# Patient Record
Sex: Male | Born: 1970 | Race: White | Hispanic: No | Marital: Married | Smoking: Former smoker
Health system: Southern US, Community
[De-identification: ages and names within clinical notes are randomized; demographics above are authoritative.]

## PROBLEM LIST (undated history)

## (undated) DIAGNOSIS — I10 Essential (primary) hypertension: Secondary | ICD-10-CM

## (undated) DIAGNOSIS — F419 Anxiety disorder, unspecified: Secondary | ICD-10-CM

## (undated) DIAGNOSIS — G4730 Sleep apnea, unspecified: Secondary | ICD-10-CM

## (undated) DIAGNOSIS — J45909 Unspecified asthma, uncomplicated: Secondary | ICD-10-CM

## (undated) DIAGNOSIS — F909 Attention-deficit hyperactivity disorder, unspecified type: Secondary | ICD-10-CM

## (undated) HISTORY — PX: EYE SURGERY: SHX253

## (undated) HISTORY — PX: SPINE SURGERY: SHX786

## (undated) HISTORY — DX: Essential (primary) hypertension: I10

## (undated) HISTORY — PX: HERNIA REPAIR: SHX51

## (undated) HISTORY — DX: Anxiety disorder, unspecified: F41.9

---

## 2005-07-20 ENCOUNTER — Ambulatory Visit (HOSPITAL_COMMUNITY): Admission: RE | Admit: 2005-07-20 | Discharge: 2005-07-21 | Payer: Self-pay | Admitting: Neurosurgery

## 2012-05-29 ENCOUNTER — Ambulatory Visit (INDEPENDENT_AMBULATORY_CARE_PROVIDER_SITE_OTHER): Payer: BC Managed Care – PPO | Admitting: Emergency Medicine

## 2012-05-29 VITALS — BP 135/83 | HR 73 | Temp 98.8°F | Resp 16 | Ht 72.0 in | Wt 267.0 lb

## 2012-05-29 DIAGNOSIS — J209 Acute bronchitis, unspecified: Secondary | ICD-10-CM

## 2012-05-29 MED ORDER — ALBUTEROL 90 MCG/ACT IN AERS: 2.0000 | INHALATION_SPRAY | RESPIRATORY_TRACT | Status: DC | PRN

## 2012-05-29 MED ORDER — PROMETHAZINE-CODEINE 6.25-10 MG/5ML PO SYRP: 5.0000 mL | ORAL_SOLUTION | Freq: Four times a day (QID) | ORAL | Status: AC | PRN

## 2012-05-29 NOTE — Patient Instructions (Signed)
  Place cough patient instructions here.  

## 2012-05-29 NOTE — Progress Notes (Signed)
  Subjective:     Billy Gomez is a 41 y.o. male here for evaluation of a cough. Onset of symptoms was 3 week ago. Symptoms have been gradually improving since that time. The cough is productive of yellow sputum and raspy and is aggravated by nothing. Associated symptoms include: postnasal drip. Patient does not have a history of asthma. Patient does not have a history of environmental allergens. Patient has not traveled recently. Patient does not have a history of smoking. Patient has had a previous chest x-ray. Patient has not had a PPD done.  The following portions of the patient's history were reviewed and updated as appropriate: allergies, current medications, past family history, past medical history, past social history, past surgical history and problem list.  Review of Systems A comprehensive review of systems was negative.    Objective:    Oxygen saturation 21% on room air BP 135/83  Pulse 73  Temp(Src) 98.8 F (37.1 C) (Oral)  Resp 16  Ht 6' (1.829 m)  Wt 267 lb (121.11 kg)  BMI 36.21 kg/m2  SpO2 97%  General Appearance:    Alert, cooperative, no distress, appears stated age  Head:    Normocephalic, without obvious abnormality, atraumatic  Eyes:    PERRL, conjunctiva/corneas clear, EOM's intact, fundi    benign, both eyes       Ears:    Normal TM's and external ear canals, both ears  Nose:   Nares normal, septum midline, mucosa normal, no drainage    or sinus tenderness  Throat:   Lips, mucosa, and tongue normal; teeth and gums normal  Neck:   Supple, symmetrical, trachea midline, no adenopathy;       thyroid:  No enlargement/tenderness/nodules; no carotid   bruit or JVD  Back:     Symmetric, no curvature, ROM normal, no CVA tenderness  Lungs:     Clear to auscultation bilaterally, respirations unlabored  Chest wall:    No tenderness or deformity  Heart:    Regular rate and rhythm, S1 and S2 normal, no murmur, rub   or gallop  Abdomen:     Soft, non-tender, bowel  sounds active all four quadrants,    no masses, no organomegaly  Genitalia:    Normal male without lesion, discharge or tenderness  Rectal:    Normal tone, normal prostate, no masses or tenderness;   guaiac negative stool  Extremities:   Extremities normal, atraumatic, no cyanosis or edema  Pulses:   2+ and symmetric all extremities  Skin:   Skin color, texture, turgor normal, no rashes or lesions  Lymph nodes:   Cervical, supraclavicular, and axillary nodes normal  Neurologic:   CNII-XII intact. Normal strength, sensation and reflexes      throughout      Assessment:    Reactive Airway Disease    Plan:    Explained lack of efficacy of antibiotics in viral disease. Antitussives per medication orders. Avoid exposure to tobacco smoke and fumes. B-agonist inhaler. Call if shortness of breath worsens, blood in sputum, change in character of cough, development of fever or chills, inability to maintain nutrition and hydration. Avoid exposure to tobacco smoke and fumes.

## 2013-03-18 ENCOUNTER — Ambulatory Visit (INDEPENDENT_AMBULATORY_CARE_PROVIDER_SITE_OTHER): Payer: BC Managed Care – PPO | Admitting: Emergency Medicine

## 2013-03-18 VITALS — BP 158/94 | HR 70 | Temp 99.0°F | Resp 17 | Ht 71.5 in | Wt 272.0 lb

## 2013-03-18 DIAGNOSIS — J309 Allergic rhinitis, unspecified: Secondary | ICD-10-CM

## 2013-03-18 DIAGNOSIS — J9801 Acute bronchospasm: Secondary | ICD-10-CM

## 2013-03-18 MED ORDER — FLUTICASONE-SALMETEROL 250-50 MCG/DOSE IN AEPB: 1.0000 | INHALATION_SPRAY | Freq: Two times a day (BID) | RESPIRATORY_TRACT | Status: DC

## 2013-03-18 NOTE — Patient Instructions (Addendum)
Allergic Rhinitis  Allergic rhinitis is when the mucous membranes in the nose respond to allergens. Allergens are particles in the air that cause your body to have an allergic reaction. This causes you to release allergic antibodies. Through a chain of events, these eventually cause you to release histamine into the blood stream (hence the use of antihistamines). Although meant to be protective to the body, it is this release that causes your discomfort, such as frequent sneezing, congestion and an itchy runny nose.    CAUSES    The pollen allergens may come from grasses, trees, and weeds. This is seasonal allergic rhinitis, or "hay fever." Other allergens cause year-round allergic rhinitis (perennial allergic rhinitis) such as house dust mite allergen, pet dander and mold spores.    SYMPTOMS     Nasal stuffiness (congestion).   Runny, itchy nose with sneezing and tearing of the eyes.   There is often an itching of the mouth, eyes and ears.  It cannot be cured, but it can be controlled with medications.  DIAGNOSIS    If you are unable to determine the offending allergen, skin or blood testing may find it.  TREATMENT     Avoid the allergen.   Medications and allergy shots (immunotherapy) can help.   Hay fever may often be treated with antihistamines in pill or nasal spray forms. Antihistamines block the effects of histamine. There are over-the-counter medicines that may help with nasal congestion and swelling around the eyes. Check with your caregiver before taking or giving this medicine.  If the treatment above does not work, there are many new medications your caregiver can prescribe. Stronger medications may be used if initial measures are ineffective. Desensitizing injections can be used if medications and avoidance fails. Desensitization is when a patient is given ongoing shots until the body becomes less sensitive to the allergen. Make sure you follow up with your caregiver if problems continue.   SEEK MEDICAL CARE IF:     You develop fever (more than 100.5 F (38.1 C).   You develop a cough that does not stop easily (persistent).   You have shortness of breath.   You start wheezing.   Symptoms interfere with normal daily activities.  Document Released: 09/06/2001 Document Revised: 03/05/2012 Document Reviewed: 03/18/2009  [employer] Patient Information 2013 [employer].

## 2013-03-18 NOTE — Progress Notes (Signed)
[facility] [address] [place] [address] [phone]  Date:  03/18/2013   Name:  Billy Gomez   DOB:  Jun 13, 1971   MRN:  [id number]  PCP:  No primary provider on file.    Chief Complaint: Shortness of Breath   History of Present Illness:  Billy Gomez is a 42 y.o. very pleasant male patient who presents with the following:  History of recurrent bronchitis with reactive airway disease. Has for the past three weeks needed to use the rescue inhaler multiple times a day, especially at night.  No fever or chills.  No wheezing or shortness of breath.  Has a cough productive of occasional purulent sputum.  No nausea or vomiting.  No coryza.  No improvement with over the counter medications or other home remedies. Denies other complaint or health concern today.   There is no problem list on file for this patient.   Past Medical History  Diagnosis Date  . Anxiety   . Hypertension     Past Surgical History  Procedure Laterality Date  . Hernia repair    . Spine surgery      History  Substance Use Topics  . Smoking status: Current Some Day Smoker    Types: Cigarettes  . Smokeless tobacco: Former Neurosurgeon    Types: Snuff    Quit date: 04/28/2012  . Alcohol Use: No    Family History  Problem Relation Age of Onset  . Asthma Sister   . Allergies Sister   . Heart disease Maternal Grandfather   . Heart disease Paternal Grandfather     No Known Allergies  Medication list has been reviewed and updated.  Current Outpatient Prescriptions on File Prior to Visit  Medication Sig Dispense Refill  . albuterol (PROVENTIL,VENTOLIN) 90 MCG/ACT inhaler Inhale 2 puffs into the lungs every 4 (four) hours as needed.  17 g  3  . hydrochlorothiazide (HYDRODIURIL) 25 MG tablet Take 25 mg by mouth daily.      . pravastatin (PRAVACHOL) 10 MG tablet Take 10 mg by mouth daily.      Marland Kitchen buPROPion (WELLBUTRIN) 75 MG tablet Take 75 mg by mouth 2 (two) times daily.        No current facility-administered medications on file prior to visit.    Review of Systems:  As per HPI, otherwise negative.    Physical Examination: Filed Vitals:   03/18/13 0922  BP: 158/94  Pulse: 70  Temp: 99 F (37.2 C)  Resp: 17   Filed Vitals:   03/18/13 0922  Height: 5' 11.5" (1.816 m)  Weight: 272 lb (123.378 kg)   Body mass index is 37.41 kg/(m^2). Ideal Body Weight: Weight in (lb) to have BMI = 25: 181.4  GEN: WDWN, NAD, Non-toxic, A & O x 3 HEENT: Atraumatic, Normocephalic. Neck supple. No masses, No LAD. Ears and Nose: No external deformity. CV: RRR, No M/G/R. No JVD. No thrill. No extra heart sounds. PULM: CTA B, scattered wheezes, no crackles, rhonchi. No retractions. No resp. distress. No accessory muscle use. ABD: S, NT, ND, +BS. No rebound. No HSM. EXTR: No c/c/e NEURO Normal gait.  PSYCH: Normally interactive. Conversant. Not depressed or anxious appearing.  Calm demeanor.    Assessment and Plan: Bronchospasm allergic rhinitis Add advair and allegra  Signed,  Phillips Odor, MD

## 2014-03-22 ENCOUNTER — Other Ambulatory Visit: Payer: Self-pay | Admitting: Emergency Medicine

## 2014-04-22 ENCOUNTER — Other Ambulatory Visit: Payer: Self-pay | Admitting: Physician Assistant

## 2014-05-13 ENCOUNTER — Other Ambulatory Visit: Payer: Self-pay | Admitting: Physician Assistant

## 2014-05-31 ENCOUNTER — Telehealth: Payer: Self-pay

## 2014-05-31 NOTE — Telephone Encounter (Signed)
NEED REFILL OF ADVAIR

## 2014-06-02 NOTE — Telephone Encounter (Signed)
Notified pt that we need to see him to RF since not seen >yr. Pt voiced agreement and gave him office hours.

## 2014-08-30 ENCOUNTER — Ambulatory Visit (INDEPENDENT_AMBULATORY_CARE_PROVIDER_SITE_OTHER): Payer: Managed Care, Other (non HMO) | Admitting: Internal Medicine

## 2014-08-30 VITALS — BP 142/74 | HR 88 | Temp 98.2°F | Resp 18 | Ht 72.0 in | Wt 258.2 lb

## 2014-08-30 DIAGNOSIS — J4530 Mild persistent asthma, uncomplicated: Secondary | ICD-10-CM | POA: Insufficient documentation

## 2014-08-30 DIAGNOSIS — E785 Hyperlipidemia, unspecified: Secondary | ICD-10-CM

## 2014-08-30 DIAGNOSIS — L723 Sebaceous cyst: Secondary | ICD-10-CM

## 2014-08-30 DIAGNOSIS — I10 Essential (primary) hypertension: Secondary | ICD-10-CM

## 2014-08-30 DIAGNOSIS — Z23 Encounter for immunization: Secondary | ICD-10-CM

## 2014-08-30 DIAGNOSIS — E782 Mixed hyperlipidemia: Secondary | ICD-10-CM | POA: Insufficient documentation

## 2014-08-30 DIAGNOSIS — Z125 Encounter for screening for malignant neoplasm of prostate: Secondary | ICD-10-CM

## 2014-08-30 DIAGNOSIS — J45909 Unspecified asthma, uncomplicated: Secondary | ICD-10-CM

## 2014-08-30 DIAGNOSIS — Z6835 Body mass index (BMI) 35.0-35.9, adult: Secondary | ICD-10-CM

## 2014-08-30 LAB — COMPREHENSIVE METABOLIC PANEL
ALK PHOS: 55 U/L (ref 39–117)
ALT: 41 U/L (ref 0–53)
AST: 21 U/L (ref 0–37)
Albumin: 5.1 g/dL (ref 3.5–5.2)
BILIRUBIN TOTAL: 0.8 mg/dL (ref 0.2–1.2)
BUN: 17 mg/dL (ref 6–23)
CO2: 28 mEq/L (ref 19–32)
CREATININE: 0.84 mg/dL (ref 0.50–1.35)
Calcium: 9.7 mg/dL (ref 8.4–10.5)
Chloride: 103 mEq/L (ref 96–112)
GLUCOSE: 98 mg/dL (ref 70–99)
POTASSIUM: 3.9 meq/L (ref 3.5–5.3)
SODIUM: 139 meq/L (ref 135–145)
Total Protein: 7.2 g/dL (ref 6.0–8.3)

## 2014-08-30 LAB — LIPID PANEL
Cholesterol: 232 mg/dL — ABNORMAL HIGH (ref 0–200)
HDL: 53 mg/dL (ref >39)
LDL CALC: 113 mg/dL — AB (ref 0–99)
TRIGLYCERIDES: 329 mg/dL — AB (ref <150)
Total CHOL/HDL Ratio: 4.4 Ratio
VLDL: 66 mg/dL — AB (ref 0–40)

## 2014-08-30 LAB — CBC WITH DIFFERENTIAL/PLATELET
BASOS PCT: 1 % (ref 0–1)
Basophils Absolute: 0.1 10*3/uL (ref 0.0–0.1)
EOS PCT: 8 % — AB (ref 0–5)
Eosinophils Absolute: 0.6 10*3/uL (ref 0.0–0.7)
HCT: 47.2 % (ref 39.0–52.0)
HEMOGLOBIN: 16.1 g/dL (ref 13.0–17.0)
Lymphocytes Relative: 29 % (ref 12–46)
Lymphs Abs: 2.2 10*3/uL (ref 0.7–4.0)
MCH: 32.7 pg (ref 26.0–34.0)
MCHC: 34.1 g/dL (ref 30.0–36.0)
MCV: 95.7 fL (ref 78.0–100.0)
MONO ABS: 0.8 10*3/uL (ref 0.1–1.0)
MONOS PCT: 10 % (ref 3–12)
Neutro Abs: 4 10*3/uL (ref 1.7–7.7)
Neutrophils Relative %: 52 % (ref 43–77)
PLATELETS: 202 10*3/uL (ref 150–400)
RBC: 4.93 MIL/uL (ref 4.22–5.81)
RDW: 13 % (ref 11.5–15.5)
WBC: 7.6 10*3/uL (ref 4.0–10.5)

## 2014-08-30 MED ORDER — HYDROCHLOROTHIAZIDE 12.5 MG PO CAPS: 12.5000 mg | ORAL_CAPSULE | Freq: Every day | ORAL | Status: DC

## 2014-08-30 MED ORDER — FLUTICASONE-SALMETEROL 250-50 MCG/DOSE IN AEPB: 1.0000 | INHALATION_SPRAY | Freq: Two times a day (BID) | RESPIRATORY_TRACT | Status: DC

## 2014-08-30 MED ORDER — ALBUTEROL SULFATE HFA 108 (90 BASE) MCG/ACT IN AERS: 2.0000 | INHALATION_SPRAY | Freq: Four times a day (QID) | RESPIRATORY_TRACT | Status: DC | PRN

## 2014-08-30 NOTE — Progress Notes (Addendum)
Subjective:    Patient ID: NEGAN GRUDZIEN, male    DOB: [birth year], 43 y.o.   MRN: [id number] This chart was scribed for Ellamae Sia, MD by [place] Peace, ED Scribe. The patient was seen in [facility].. The patient's care was started at 9:01 AM.   HPI HPI Comments: UZZIAH RIGG is a 43 y.o. male who presents to the [facility] complaining of cyst to the lateral aspect of the left side of his neck which pt reports has been there for a while. He reports that it started off as a little "spec" and then after squeezing on it to get some drainage out of it, the problematic area started to grow and harden. He denies any noticed drainage.   Pt also expresses concern regarding brown "mole" on the lateral aspect of his right forearm that he is worried maybe cancerous. He reports that the spot has been there for 6 months.  Pt also wants to receive medication to treat his "asthma" that he states he has not been formally diagnosed with having but states he believes he has. He states that he has been using Advair frequently in order to address issue which has provided some relief. Pt states that he does notice himself wheezing sometimes and reports family history of breathing problems with his father. He states that he was previously 40 lbs heavier than he is today.   Pt denies that he is current smoker but states he does use tobacco products. Pt has family history of mental illnesses in his family and heart attacks with both of his grandfathers. He also reports history of prostate cancer in his grandfather and breast cancer in another family member. He states he does suffer occasionally with acid reflux problems and further reports diagnosis of sleep apnea four years ago. His last tetanus shot is unknown and elects to receive one today.   SH stable  Review of Systems  Constitutional: Negative for fever and chills.  HENT:       Recent eye exam wnl x vision  Respiratory: Negative for shortness of breath.     Cardiovascular: Negative for chest pain.  Gastrointestinal: Negative for nausea, vomiting and abdominal pain.       Rare gerd  Genitourinary: Negative.   Musculoskeletal: Negative.   Skin:       Cyst to left side of neck.  Unusual brown mole on right forearm.   Neurological: Negative.        Objective:   Physical Exam  Nursing note and vitals reviewed. Constitutional: He is oriented to person, place, and time. He appears well-developed and well-nourished. No distress.  HENT:  Head: Normocephalic and atraumatic.  Eyes: Conjunctivae and EOM are normal. Pupils are equal, round, and reactive to light.  Neck: Neck supple. No thyromegaly present.  Cardiovascular: Normal rate, regular rhythm and normal heart sounds.   No murmur heard. Pulmonary/Chest: Effort normal and breath sounds normal. No respiratory distress. He has no wheezes.  Musculoskeletal: Normal range of motion. He exhibits no edema.  Lymphadenopathy:    He has no cervical adenopathy.  Neurological: He is alert and oriented to person, place, and time.  Skin: Skin is warm and dry.  1cm freely movable firm subcutnaeous nodule on left posterior cervical area. Sebker right arm.   Psychiatric: He has a normal mood and affect. His behavior is normal.          Assessment & Plan:  Treatment plan was discussed with patient who verbalizes understanding and  agrees.  I have completed the patient encounter in its entirety as documented by the scribe, with editing by me where necessary. Lemon Whitacre P. Merla Riches, M.D. Essential hypertension - Plan: CBC with Differential, Comprehensive metabolic panel  Other and unspecified hyperlipidemia - Plan: Lipid panel  Screening for prostate cancer - Plan: PSA  Need for diphtheria-tetanus-pertussis (Tdap) vaccine - Plan: Tdap vaccine greater than or equal to 7yo IM  Sebaceous cyst--excision  RAD (reactive airway disease), mild persistent, uncomplicated  BMI 35.0-35.9,adult  Meds  ordered this encounter  Medications  . Fluticasone-Salmeterol (ADVAIR DISKUS) 250-50 MCG/DOSE AEPB    Sig: Inhale 1 puff into the lungs 2 (two) times daily.    Dispense:  1 each    Refill:  5  . albuterol (PROVENTIL HFA;VENTOLIN HFA) 108 (90 BASE) MCG/ACT inhaler    Sig: Inhale 2 puffs into the lungs every 6 (six) hours as needed for wheezing or shortness of breath.    Dispense:  1 Inhaler    Refill:  2  . hydrochlorothiazide (MICROZIDE) 12.5 MG capsule    Sig: Take 1 capsule (12.5 mg total) by mouth daily.    Dispense:  30 capsule    Refill:  11   Will send in lipid rx after labs Changed to 12.5 hctz F/u SR  Add-needs lipitor at 

## 2014-08-30 NOTE — Progress Notes (Signed)
PROCEDURE: Verbal consent obtained from patient.  Local anesthesia with 3cc Lidocaine 2% with epinephrine.  Sterile prep and drape.  Incision with 15 blade, and dissection of the cyst, with removal en toto, though ruptured.  Wound cavity explored and sebaceous material removed.  Wound closed with #2 5-0 Prolene horizontal mattress sutures.  Wound cleansed and dressed.

## 2014-08-30 NOTE — Patient Instructions (Signed)

## 2014-09-01 ENCOUNTER — Encounter: Payer: Self-pay | Admitting: Internal Medicine

## 2014-09-01 LAB — PSA: PSA: 2.45 ng/mL (ref <=4.00)

## 2014-09-02 MED ORDER — ATORVASTATIN CALCIUM 20 MG PO TABS: 20.0000 mg | ORAL_TABLET | Freq: Every day | ORAL | Status: DC

## 2014-09-02 NOTE — Addendum Note (Signed)
Addended by: Tonye Pearson on: 09/02/2014 06:53 PM   Modules accepted: Orders

## 2015-01-27 ENCOUNTER — Ambulatory Visit (INDEPENDENT_AMBULATORY_CARE_PROVIDER_SITE_OTHER): Payer: Managed Care, Other (non HMO)

## 2015-01-27 ENCOUNTER — Ambulatory Visit (INDEPENDENT_AMBULATORY_CARE_PROVIDER_SITE_OTHER): Payer: Managed Care, Other (non HMO) | Admitting: Family Medicine

## 2015-01-27 VITALS — BP 128/86 | HR 69 | Temp 98.1°F | Resp 18 | Ht 73.0 in | Wt 271.6 lb

## 2015-01-27 DIAGNOSIS — R062 Wheezing: Secondary | ICD-10-CM

## 2015-01-27 DIAGNOSIS — R05 Cough: Secondary | ICD-10-CM

## 2015-01-27 DIAGNOSIS — J4521 Mild intermittent asthma with (acute) exacerbation: Secondary | ICD-10-CM

## 2015-01-27 DIAGNOSIS — R059 Cough, unspecified: Secondary | ICD-10-CM

## 2015-01-27 MED ORDER — AZITHROMYCIN 250 MG PO TABS: ORAL_TABLET | ORAL | Status: DC

## 2015-01-27 MED ORDER — ALBUTEROL SULFATE (2.5 MG/3ML) 0.083% IN NEBU
2.5000 mg | INHALATION_SOLUTION | Freq: Once | RESPIRATORY_TRACT | Status: AC
  Administered 2015-01-27: 2.5 mg via RESPIRATORY_TRACT

## 2015-01-27 NOTE — Progress Notes (Signed)
Subjective:  This chart was scribed for Billy Staggers MD, by Veverly Fells, at [facility].  This patient was seen in room 10 and the patient's care was started at 9:54 AM.    Patient ID: Billy Gomez, male    DOB: 10-02-1971, 44 y.o.   MRN: [id number]  HPI  HPI Comments: Billy Gomez is a 44 y.o. male who presents to [facility] for a cough.  In review of his chart he has a history of hypertension and hyperlipidemia reactive airway disease as was seen by Dr.Dootlittle Sep 5th 2015.  He has been using Advair at times when he heard himself wheezing.  Also noted to have acid reflux at that time.  As well as a previous diagnosis of sleep apnea. He was continued on Advair 250/50 1 puff twice per day and albuterol as needed.   Cough: Onset several weeks ago.  Patient notes that his cough is worse when he is laying down and notes of a rattling sound.  States that his cough is keeping him up some nights and feels the congestion when he takes a deep exhale or gets winded at work. He also states that he had intermittent heartburn few days ago which was alleviated with Tums.  Denies any fever associated with his cough, shortness of breath, chest pain/ pressure, swelling of his extremities. Uses a CPAP at night for his sleep apnea.He is still taking advair (1 puff twice a day).  He has never been tested for asthma.  Patient has been taking cough medication (Dayquil) to try to alleviate his symptoms. Patient uses dip(snuff), and has smoked in the past. Patient has a family history of heart disease (both grandfathers), but no one else in his direct family.  Mother had cancer. Patient is around many customers at work.      Patient Active Problem List   Diagnosis Date Noted  . Essential hypertension 08/30/2014  . Other and unspecified hyperlipidemia 08/30/2014  . RAD (reactive airway disease) 08/30/2014  . BMI 35.0-35.9,adult 08/30/2014   Past  Medical History  Diagnosis Date  . Anxiety   . Hypertension    Past Surgical History  Procedure Laterality Date  . Hernia repair    . Spine surgery     No Known Allergies Prior to Admission medications   Medication Sig Start Date End Date Taking? Authorizing Provider  albuterol (PROVENTIL HFA;VENTOLIN HFA) 108 (90 BASE) MCG/ACT inhaler Inhale 2 puffs into the lungs every 6 (six) hours as needed for wheezing or shortness of breath. 08/30/14  Yes Tonye Pearson, MD  atorvastatin (LIPITOR) 20 MG tablet Take 1 tablet (20 mg total) by mouth daily. 09/02/14  Yes Tonye Pearson, MD  Fluticasone-Salmeterol (ADVAIR DISKUS) 250-50 MCG/DOSE AEPB Inhale 1 puff into the lungs 2 (two) times daily. 08/30/14  Yes Tonye Pearson, MD  hydrochlorothiazide (MICROZIDE) 12.5 MG capsule Take 1 capsule (12.5 mg total) by mouth daily. 08/30/14  Yes Tonye Pearson, MD  pravastatin (PRAVACHOL) 10 MG tablet Take 10 mg by mouth daily.    Historical Provider, MD   History   Social History  . Marital Status: Married    Spouse Name: N/A    Number of Children: N/A  . Years of Education: N/A   Occupational History  . Not on file.   Social History Main Topics  . Smoking status: Current Some Day Smoker    Types: Cigarettes  . Smokeless tobacco: Former Neurosurgeon  Types: Snuff    Quit date: 04/28/2012  . Alcohol Use: No  . Drug Use: No  . Sexual Activity: Yes    Birth Control/ Protection: None   Other Topics Concern  . Not on file   Social History Narrative         Review of Systems  Constitutional: Negative for fever.  Respiratory: Positive for cough. Negative for shortness of breath.   Cardiovascular: Negative for chest pain and leg swelling.       Objective:   Physical Exam  Constitutional: He is oriented to person, place, and time. He appears well-developed and well-nourished.  HENT:  Head: Normocephalic and atraumatic.  Right Ear: Tympanic membrane, external ear and ear canal  normal.  Left Ear: Tympanic membrane, external ear and ear canal normal.  Nose: No rhinorrhea.  Mouth/Throat: Oropharynx is clear and moist and mucous membranes are normal. No oropharyngeal exudate or posterior oropharyngeal erythema.  Eyes: Conjunctivae are normal. Pupils are equal, round, and reactive to light.  Neck: Neck supple.  Cardiovascular: Normal rate, regular rhythm, normal heart sounds and intact distal pulses.   No murmur heard. Pulmonary/Chest: No respiratory distress. He has no rhonchi. He has no rales.  He has diffused expriatory wheeze with some coarse breath sounds left lower lobe.     Abdominal: Soft. There is no tenderness.  Lymphadenopathy:    He has no cervical adenopathy.  Neurological: He is alert and oriented to person, place, and time.  Skin: Skin is warm and dry. No rash noted.  Psychiatric: He has a normal mood and affect. His behavior is normal.  Vitals reviewed.  Filed Vitals:   01/27/15 0944  BP: 128/86  Pulse: 69  Temp: 98.1 F (36.7 C)  TempSrc: Oral  Resp: 18  Height: 6\' 1"  (1.854 m)  Weight: 271 lb 9.6 oz (123.197 kg)  SpO2: 98%     Albuterol 2.5 Neb given.  He has improved airflow. Still few coarse breath sounds bases bilaterally. He subjectively felt improved. Peak flow reading is 650, about over 100 % of predicted.  [facility] (PRIMARY) x-ray report read by Dr. Neva Seat He has diffuse scattered bronchial markings, left greater than right without discrete infiltrate.         Assessment & Plan:   Billy Gomez is a 44 y.o. male Cough - Plan: DG Chest 2 View, albuterol (PROVENTIL) (2.5 MG/3ML) 0.083% nebulizer solution 2.5 mg, azithromycin (ZITHROMAX) 250 MG tablet  Wheezing - Plan: DG Chest 2 View, albuterol (PROVENTIL) (2.5 MG/3ML) 0.083% nebulizer solution 2.5 mg  Asthmatic bronchitis, mild intermittent, with acute exacerbation - Plan: azithromycin (ZITHROMAX) 250 MG tablet  Suspected asthmatic bronchitis flare, but may have some  element of LPR.  -start Zpak, mucinex (stop if increasing wheeze), and ok to try zantac for next week or two for LPR if this is contributing to cough.   -albuterol if needed, but if persistent or frequent use - RTC for possible prednisone start.   -rtc precautions.    Meds ordered this encounter  Medications  . albuterol (PROVENTIL) (2.5 MG/3ML) 0.083% nebulizer solution 2.5 mg    Sig:   . azithromycin (ZITHROMAX) 250 MG tablet    Sig: Take 2 pills by mouth on day 1, then 1 pill by mouth per day on days 2 through 5.    Dispense:  6 tablet    Refill:  0   Patient Instructions  Start antibiotic for bronchitis (Zpak).  Zantac over the counter once per day for  heartburn (as reflux can also cause cough). Mucinex over the counter ok UNLESS it makes you wheeze more - then stop it.  Continue Advair twice per day, albuterol inhaler up to every 4 hours as needed for wheezing.  If you require the albuterol more than 2-3 times per day or frequent use needed more than 2 days in a row- return as may need to start prednisone.  Return to the clinic or go to the nearest emergency room if any of your symptoms worsen or new symptoms occur.  Acute Bronchitis Bronchitis is inflammation of the airways that extend from the windpipe into the lungs (bronchi). The inflammation often causes mucus to develop. This leads to a cough, which is the most common symptom of bronchitis.  In acute bronchitis, the condition usually develops suddenly and goes away over time, usually in a couple weeks. Smoking, allergies, and asthma can make bronchitis worse. Repeated episodes of bronchitis may cause further lung problems.  CAUSES Acute bronchitis is most often caused by the same virus that causes a cold. The virus can spread from person to person (contagious) through coughing, sneezing, and touching contaminated objects. SIGNS AND SYMPTOMS   Cough.   Fever.   Coughing up mucus.   Body aches.   Chest congestion.    Chills.   Shortness of breath.   Sore throat.  DIAGNOSIS  Acute bronchitis is usually diagnosed through a physical exam. Your health care provider will also ask you questions about your medical history. Tests, such as chest X-rays, are sometimes done to rule out other conditions.  TREATMENT  Acute bronchitis usually goes away in a couple weeks. Oftentimes, no medical treatment is necessary. Medicines are sometimes given for relief of fever or cough. Antibiotic medicines are usually not needed but may be prescribed in certain situations. In some cases, an inhaler may be recommended to help reduce shortness of breath and control the cough. A cool mist vaporizer may also be used to help thin bronchial secretions and make it easier to clear the chest.  HOME CARE INSTRUCTIONS  Get plenty of rest.   Drink enough fluids to keep your urine clear or pale yellow (unless you have a medical condition that requires fluid restriction). Increasing fluids may help thin your respiratory secretions (sputum) and reduce chest congestion, and it will prevent dehydration.   Take medicines only as directed by your health care provider.  If you were prescribed an antibiotic medicine, finish it all even if you start to feel better.  Avoid smoking and secondhand smoke. Exposure to cigarette smoke or irritating chemicals will make bronchitis worse. If you are a smoker, consider using nicotine gum or skin patches to help control withdrawal symptoms. Quitting smoking will help your lungs heal faster.   Reduce the chances of another bout of acute bronchitis by washing your hands frequently, avoiding people with cold symptoms, and trying not to touch your hands to your mouth, nose, or eyes.   Keep all follow-up visits as directed by your health care provider.  SEEK MEDICAL CARE IF: Your symptoms do not improve after 1 week of treatment.  SEEK IMMEDIATE MEDICAL CARE IF:  You develop an increased fever or  chills.   You have chest pain.   You have severe shortness of breath.  You have bloody sputum.   You develop dehydration.  You faint or repeatedly feel like you are going to pass out.  You develop repeated vomiting.  You develop a severe headache. MAKE SURE YOU:  Understand these instructions.  Will watch your condition.  Will get help right away if you are not doing well or get worse. Document Released: 01/19/2005 Document Revised: 04/28/2014 Document Reviewed: 06/04/2013 [facility] Patient Information 2015 [place]. This information is not intended to replace advice given to you by your health care provider. Make sure you discuss any questions you have with your health care provider.  Bronchospasm A bronchospasm is a spasm or tightening of the airways going into the lungs. During a bronchospasm breathing becomes more difficult because the airways get smaller. When this happens there can be coughing, a whistling sound when breathing (wheezing), and difficulty breathing. Bronchospasm is often associated with asthma, but not all patients who experience a bronchospasm have asthma. CAUSES  A bronchospasm is caused by inflammation or irritation of the airways. The inflammation or irritation may be triggered by:   Allergies (such as to animals, pollen, food, or mold). Allergens that cause bronchospasm may cause wheezing immediately after exposure or many hours later.   Infection. Viral infections are believed to be the most common cause of bronchospasm.   Exercise.   Irritants (such as pollution, cigarette smoke, strong odors, aerosol sprays, and paint fumes).   Weather changes. Winds increase molds and pollens in the air. Rain refreshes the air by washing irritants out. Cold air may cause inflammation.   Stress and emotional upset.  SIGNS AND SYMPTOMS   Wheezing.   Excessive nighttime coughing.   Frequent or severe coughing with a simple cold.   Chest  tightness.   Shortness of breath.  DIAGNOSIS  Bronchospasm is usually diagnosed through a history and physical exam. Tests, such as chest X-rays, are sometimes done to look for other conditions. TREATMENT   Inhaled medicines can be given to open up your airways and help you breathe. The medicines can be given using either an inhaler or a nebulizer machine.  Corticosteroid medicines may be given for severe bronchospasm, usually when it is associated with asthma. HOME CARE INSTRUCTIONS   Always have a plan prepared for seeking medical care. Know when to call your health care provider and local emergency services (911 in the [place] Know where you can access local emergency care.  Only take medicines as directed by your health care provider.  If you were prescribed an inhaler or nebulizer machine, ask your health care provider to explain how to use it correctly. Always use a spacer with your inhaler if you were given one.  It is necessary to remain calm during an attack. Try to relax and breathe more slowly.  Control your home environment in the following ways:   Change your heating and air conditioning filter at least once a month.   Limit your use of fireplaces and wood stoves.  Do not smoke and do not allow smoking in your home.   Avoid exposure to perfumes and fragrances.   Get rid of pests (such as roaches and mice) and their droppings.   Throw away plants if you see mold on them.   Keep your house clean and dust free.   Replace carpet with wood, tile, or vinyl flooring. Carpet can trap dander and dust.   Use allergy-proof pillows, mattress covers, and box spring covers.   Wash bed sheets and blankets every week in hot water and dry them in a dryer.   Use blankets that are made of polyester or cotton.   Wash hands frequently. SEEK MEDICAL CARE IF:   You have muscle aches.  You have chest pain.   The sputum changes from clear or white to yellow,  green, gray, or bloody.   The sputum you cough up gets thicker.   There are problems that may be related to the medicine you are given, such as a rash, itching, swelling, or trouble breathing.  SEEK IMMEDIATE MEDICAL CARE IF:   You have worsening wheezing and coughing even after taking your prescribed medicines.   You have increased difficulty breathing.   You develop severe chest pain. MAKE SURE YOU:   Understand these instructions.  Will watch your condition.  Will get help right away if you are not doing well or get worse. Document Released: 12/15/2003 Document Revised: 12/17/2013 Document Reviewed: 06/03/2013 [facility] Patient Information 2015 [place], [place]. This information is not intended to replace advice given to you by your health care provider. Make sure you discuss any questions you have with your health care provider.     I personally performed the services described in this documentation, which was scribed in my presence. The recorded information has been reviewed and considered, and addended by me as needed.

## 2015-01-27 NOTE — Patient Instructions (Signed)
Start antibiotic for bronchitis (Zpak).  Zantac over the counter once per day for heartburn (as reflux can also cause cough). Mucinex over the counter ok UNLESS it makes you wheeze more - then stop it.  Continue Advair twice per day, albuterol inhaler up to every 4 hours as needed for wheezing.  If you require the albuterol more than 2-3 times per day or frequent use needed more than 2 days in a row- return as may need to start prednisone.  Return to the clinic or go to the nearest emergency room if any of your symptoms worsen or new symptoms occur.  Acute Bronchitis Bronchitis is inflammation of the airways that extend from the windpipe into the lungs (bronchi). The inflammation often causes mucus to develop. This leads to a cough, which is the most common symptom of bronchitis.  In acute bronchitis, the condition usually develops suddenly and goes away over time, usually in a couple weeks. Smoking, allergies, and asthma can make bronchitis worse. Repeated episodes of bronchitis may cause further lung problems.  CAUSES Acute bronchitis is most often caused by the same virus that causes a cold. The virus can spread from person to person (contagious) through coughing, sneezing, and touching contaminated objects. SIGNS AND SYMPTOMS   Cough.   Fever.   Coughing up mucus.   Body aches.   Chest congestion.   Chills.   Shortness of breath.   Sore throat.  DIAGNOSIS  Acute bronchitis is usually diagnosed through a physical exam. Your health care provider will also ask you questions about your medical history. Tests, such as chest X-rays, are sometimes done to rule out other conditions.  TREATMENT  Acute bronchitis usually goes away in a couple weeks. Oftentimes, no medical treatment is necessary. Medicines are sometimes given for relief of fever or cough. Antibiotic medicines are usually not needed but may be prescribed in certain situations. In some cases, an inhaler may be  recommended to help reduce shortness of breath and control the cough. A cool mist vaporizer may also be used to help thin bronchial secretions and make it easier to clear the chest.  HOME CARE INSTRUCTIONS  Get plenty of rest.   Drink enough fluids to keep your urine clear or pale yellow (unless you have a medical condition that requires fluid restriction). Increasing fluids may help thin your respiratory secretions (sputum) and reduce chest congestion, and it will prevent dehydration.   Take medicines only as directed by your health care provider.  If you were prescribed an antibiotic medicine, finish it all even if you start to feel better.  Avoid smoking and secondhand smoke. Exposure to cigarette smoke or irritating chemicals will make bronchitis worse. If you are a smoker, consider using nicotine gum or skin patches to help control withdrawal symptoms. Quitting smoking will help your lungs heal faster.   Reduce the chances of another bout of acute bronchitis by washing your hands frequently, avoiding people with cold symptoms, and trying not to touch your hands to your mouth, nose, or eyes.   Keep all follow-up visits as directed by your health care provider.  SEEK MEDICAL CARE IF: Your symptoms do not improve after 1 week of treatment.  SEEK IMMEDIATE MEDICAL CARE IF:  You develop an increased fever or chills.   You have chest pain.   You have severe shortness of breath.  You have bloody sputum.   You develop dehydration.  You faint or repeatedly feel like you are going to pass out.  You develop repeated vomiting.  You develop a severe headache. MAKE SURE YOU:   Understand these instructions.  Will watch your condition.  Will get help right away if you are not doing well or get worse. Document Released: 01/19/2005 Document Revised: 04/28/2014 Document Reviewed: 06/04/2013 [facility] Patient Information 2015 [place], [place]. This information is not intended to  replace advice given to you by your health care provider. Make sure you discuss any questions you have with your health care provider.  Bronchospasm A bronchospasm is a spasm or tightening of the airways going into the lungs. During a bronchospasm breathing becomes more difficult because the airways get smaller. When this happens there can be coughing, a whistling sound when breathing (wheezing), and difficulty breathing. Bronchospasm is often associated with asthma, but not all patients who experience a bronchospasm have asthma. CAUSES  A bronchospasm is caused by inflammation or irritation of the airways. The inflammation or irritation may be triggered by:   Allergies (such as to animals, pollen, food, or mold). Allergens that cause bronchospasm may cause wheezing immediately after exposure or many hours later.   Infection. Viral infections are believed to be the most common cause of bronchospasm.   Exercise.   Irritants (such as pollution, cigarette smoke, strong odors, aerosol sprays, and paint fumes).   Weather changes. Winds increase molds and pollens in the air. Rain refreshes the air by washing irritants out. Cold air may cause inflammation.   Stress and emotional upset.  SIGNS AND SYMPTOMS   Wheezing.   Excessive nighttime coughing.   Frequent or severe coughing with a simple cold.   Chest tightness.   Shortness of breath.  DIAGNOSIS  Bronchospasm is usually diagnosed through a history and physical exam. Tests, such as chest X-rays, are sometimes done to look for other conditions. TREATMENT   Inhaled medicines can be given to open up your airways and help you breathe. The medicines can be given using either an inhaler or a nebulizer machine.  Corticosteroid medicines may be given for severe bronchospasm, usually when it is associated with asthma. HOME CARE INSTRUCTIONS   Always have a plan prepared for seeking medical care. Know when to call your health care  provider and local emergency services (911 in the [place] Know where you can access local emergency care.  Only take medicines as directed by your health care provider.  If you were prescribed an inhaler or nebulizer machine, ask your health care provider to explain how to use it correctly. Always use a spacer with your inhaler if you were given one.  It is necessary to remain calm during an attack. Try to relax and breathe more slowly.  Control your home environment in the following ways:   Change your heating and air conditioning filter at least once a month.   Limit your use of fireplaces and wood stoves.  Do not smoke and do not allow smoking in your home.   Avoid exposure to perfumes and fragrances.   Get rid of pests (such as roaches and mice) and their droppings.   Throw away plants if you see mold on them.   Keep your house clean and dust free.   Replace carpet with wood, tile, or vinyl flooring. Carpet can trap dander and dust.   Use allergy-proof pillows, mattress covers, and box spring covers.   Wash bed sheets and blankets every week in hot water and dry them in a dryer.   Use blankets that are made of polyester or cotton.  Wash hands frequently. SEEK MEDICAL CARE IF:   You have muscle aches.   You have chest pain.   The sputum changes from clear or white to yellow, green, gray, or bloody.   The sputum you cough up gets thicker.   There are problems that may be related to the medicine you are given, such as a rash, itching, swelling, or trouble breathing.  SEEK IMMEDIATE MEDICAL CARE IF:   You have worsening wheezing and coughing even after taking your prescribed medicines.   You have increased difficulty breathing.   You develop severe chest pain. MAKE SURE YOU:   Understand these instructions.  Will watch your condition.  Will get help right away if you are not doing well or get worse. Document Released: 12/15/2003 Document  Revised: 12/17/2013 Document Reviewed: 06/03/2013 [facility] Patient Information 2015 [place], [place]. This information is not intended to replace advice given to you by your health care provider. Make sure you discuss any questions you have with your health care provider.

## 2015-06-18 ENCOUNTER — Other Ambulatory Visit: Payer: Self-pay

## 2015-06-18 MED ORDER — FLUTICASONE-SALMETEROL 250-50 MCG/DOSE IN AEPB: 1.0000 | INHALATION_SPRAY | Freq: Two times a day (BID) | RESPIRATORY_TRACT | Status: DC

## 2015-08-04 ENCOUNTER — Other Ambulatory Visit: Payer: Self-pay | Admitting: Internal Medicine

## 2015-09-06 ENCOUNTER — Other Ambulatory Visit: Payer: Self-pay | Admitting: Family Medicine

## 2015-09-06 ENCOUNTER — Other Ambulatory Visit: Payer: Self-pay | Admitting: Internal Medicine

## 2015-09-06 ENCOUNTER — Other Ambulatory Visit: Payer: Self-pay | Admitting: Physician Assistant

## 2015-10-10 ENCOUNTER — Ambulatory Visit (INDEPENDENT_AMBULATORY_CARE_PROVIDER_SITE_OTHER): Payer: Managed Care, Other (non HMO) | Admitting: Urgent Care

## 2015-10-10 VITALS — BP 132/78 | HR 67 | Temp 98.3°F | Resp 16 | Ht 72.0 in | Wt 263.0 lb

## 2015-10-10 DIAGNOSIS — N529 Male erectile dysfunction, unspecified: Secondary | ICD-10-CM | POA: Diagnosis not present

## 2015-10-10 DIAGNOSIS — J4530 Mild persistent asthma, uncomplicated: Secondary | ICD-10-CM | POA: Diagnosis not present

## 2015-10-10 DIAGNOSIS — I10 Essential (primary) hypertension: Secondary | ICD-10-CM

## 2015-10-10 DIAGNOSIS — E785 Hyperlipidemia, unspecified: Secondary | ICD-10-CM | POA: Diagnosis not present

## 2015-10-10 LAB — TESTOSTERONE: Testosterone: 219 ng/dL — ABNORMAL LOW (ref 300–890)

## 2015-10-10 MED ORDER — FLUTICASONE-SALMETEROL 250-50 MCG/DOSE IN AEPB: INHALATION_SPRAY | RESPIRATORY_TRACT | Status: DC

## 2015-10-10 MED ORDER — HYDROCHLOROTHIAZIDE 12.5 MG PO CAPS: ORAL_CAPSULE | ORAL | Status: DC

## 2015-10-10 MED ORDER — ALBUTEROL SULFATE HFA 108 (90 BASE) MCG/ACT IN AERS: 2.0000 | INHALATION_SPRAY | Freq: Four times a day (QID) | RESPIRATORY_TRACT | Status: DC | PRN

## 2015-10-10 NOTE — Patient Instructions (Signed)
Managing Your High Blood Pressure Blood pressure is a measurement of how forceful your blood is pressing against the walls of the arteries. Arteries are muscular tubes within the circulatory system. Blood pressure does not stay the same. Blood pressure rises when you are active, excited, or nervous; and it lowers during sleep and relaxation. If the numbers measuring your blood pressure stay above normal most of the time, you are at risk for health problems. High blood pressure (hypertension) is a long-term (chronic) condition in which blood pressure is elevated. A blood pressure reading is recorded as two numbers, such as 120 over 80 (or 120/80). The first, higher number is called the systolic pressure. It is a measure of the pressure in your arteries as the heart beats. The second, lower number is called the diastolic pressure. It is a measure of the pressure in your arteries as the heart relaxes between beats.  Keeping your blood pressure in a normal range is important to your overall health and prevention of health problems, such as heart disease and stroke. When your blood pressure is uncontrolled, your heart has to work harder than normal. High blood pressure is a very common condition in adults because blood pressure tends to rise with age. Men and women are equally likely to have hypertension but at different times in life. Before age 45, men are more likely to have hypertension. After 46 years of age, women are more likely to have it. Hypertension is especially common in African Americans. This condition often has no signs or symptoms. The cause of the condition is usually not known. Your caregiver can help you come up with a plan to keep your blood pressure in a normal, healthy range. BLOOD PRESSURE STAGES Blood pressure is classified into four stages: normal, prehypertension, stage 1, and stage 2. Your blood pressure reading will be used to determine what type of treatment, if any, is necessary.  Appropriate treatment options are tied to these four stages:  Normal  Systolic pressure (mm Hg): below 120.  Diastolic pressure (mm Hg): below 80. Prehypertension  Systolic pressure (mm Hg): 120 to 139.  Diastolic pressure (mm Hg): 80 to 89. Stage1  Systolic pressure (mm Hg): 140 to 159.  Diastolic pressure (mm Hg): 90 to 99. Stage2  Systolic pressure (mm Hg): 160 or above.  Diastolic pressure (mm Hg): 100 or above. RISKS RELATED TO HIGH BLOOD PRESSURE Managing your blood pressure is an important responsibility. Uncontrolled high blood pressure can lead to:  A heart attack.  A stroke.  A weakened blood vessel (aneurysm).  Heart failure.  Kidney damage.  Eye damage.  Metabolic syndrome.  Memory and concentration problems. HOW TO MANAGE YOUR BLOOD PRESSURE Blood pressure can be managed effectively with lifestyle changes and medicines (if needed). Your caregiver will help you come up with a plan to bring your blood pressure within a normal range. Your plan should include the following: Education  Read all information provided by your caregivers about how to control blood pressure.  Educate yourself on the latest guidelines and treatment recommendations. New research is always being done to further define the risks and treatments for high blood pressure. Lifestylechanges  Control your weight.  Avoid smoking.  Stay physically active.  Reduce the amount of salt in your diet.  Reduce stress.  Control any chronic conditions, such as high cholesterol or diabetes.  Reduce your alcohol intake. Medicines  Several medicines (antihypertensive medicines) are available, if needed, to bring blood pressure within a normal range.   Communication  Review all the medicines you take with your caregiver because there may be side effects or interactions.  Talk with your caregiver about your diet, exercise habits, and other lifestyle factors that may be contributing to  high blood pressure.  See your caregiver regularly. Your caregiver can help you create and adjust your plan for managing high blood pressure. RECOMMENDATIONS FOR TREATMENT AND FOLLOW-UP  The following recommendations are based on current guidelines for managing high blood pressure in nonpregnant adults. Use these recommendations to identify the proper follow-up period or treatment option based on your blood pressure reading. You can discuss these options with your caregiver.  Systolic pressure of 120 to 139 or diastolic pressure of 80 to 89: Follow up with your caregiver as directed.  Systolic pressure of 140 to 160 or diastolic pressure of 90 to 100: Follow up with your caregiver within 2 months.  Systolic pressure above 160 or diastolic pressure above 100: Follow up with your caregiver within 1 month.  Systolic pressure above 180 or diastolic pressure above 110: Consider antihypertensive therapy; follow up with your caregiver within 1 week.  Systolic pressure above 200 or diastolic pressure above 120: Begin antihypertensive therapy; follow up with your caregiver within 1 week.   This information is not intended to replace advice given to you by your health care provider. Make sure you discuss any questions you have with your health care provider.   Document Released: 09/05/2012 Document Reviewed: 09/05/2012 Elsevier Interactive Patient Education 2016 [employer].    Tadalafil tablets (Cialis) What is this medicine? TADALAFIL (tah DA la fil) is used to treat erection problems in men. It is also used for enlargement of the prostate gland in men, a condition called benign prostatic hyperplasia or BPH. This medicine improves urine flow and reduces BPH symptoms. This medicine can also treat both erection problems and BPH when they occur together. This medicine may be used for other purposes; ask your health care provider or pharmacist if you have questions. What should I tell my health care  provider before I take this medicine? They need to know if you have any of these conditions: -bleeding disorders -eye or vision problems, including a rare inherited eye disease called retinitis pigmentosa -anatomical deformation of the penis, Peyronie's disease, or history of priapism (painful and prolonged erection) -heart disease, angina, a history of heart attack, irregular heart beats, or other heart problems -high or low blood pressure -history of blood diseases, like sickle cell anemia or leukemia -history of stomach bleeding -kidney disease -liver disease -stroke -an unusual or allergic reaction to tadalafil, other medicines, foods, dyes, or preservatives -pregnant or trying to get pregnant -breast-feeding How should I use this medicine? Take this medicine by mouth with a glass of water. Follow the directions on the prescription label. You may take this medicine with or without meals. When this medicine is used for erection problems, your doctor may prescribe it to be taken once daily or as needed. If you are taking the medicine as needed, you may be able to have sexual activity 30 minutes after taking it and for up to 36 hours after taking it. Whether you are taking the medicine as needed or once daily, you should not take more than one dose per day. If you are taking this medicine for symptoms of benign prostatic hyperplasia (BPH) or to treat both BPH and an erection problem, take the dose once daily at about the same time each day. Do not take your  medicine more often than directed. Talk to your pediatrician regarding the use of this medicine in children. Special care may be needed. Overdosage: If you think you have taken too much of this medicine contact a poison control center or emergency room at once. NOTE: This medicine is only for you. Do not share this medicine with others. What if I miss a dose? If you are taking this medicine as needed for erection problems, this does not  apply. If you miss a dose while taking this medicine once daily for an erection problem, benign prostatic hyperplasia, or both, take it as soon as you remember, but do not take more than one dose per day. What may interact with this medicine? Do not take this medicine with any of the following medications: -nitrates like amyl nitrite, isosorbide dinitrate, isosorbide mononitrate, nitroglycerin -other medicines for erectile dysfunction like avanafil, sildenafil, vardenafil -other tadalafil products (Adcirca) -riociguat This medicine may also interact with the following medications: -certain drugs for high blood pressure -certain drugs for the treatment of HIV infection or AIDS -certain drugs used for fungal or yeast infections, like fluconazole, itraconazole, ketoconazole, and voriconazole -certain drugs used for seizures like carbamazepine, phenytoin, and phenobarbital -grapefruit juice -macrolide antibiotics like clarithromycin, erythromycin, troleandomycin -medicines for prostate problems -rifabutin, rifampin or rifapentine This list may not describe all possible interactions. Give your health care provider a list of all the medicines, herbs, non-prescription drugs, or dietary supplements you use. Also tell them if you smoke, drink alcohol, or use illegal drugs. Some items may interact with your medicine. What should I watch for while using this medicine? If you notice any changes in your vision while taking this drug, call your doctor or health care professional as soon as possible. Stop using this medicine and call your health care provider right away if you have a loss of sight in one or both eyes. Contact your doctor or health care professional right away if the erection lasts longer than 4 hours or if it becomes painful. This may be a sign of serious problem and must be treated right away to prevent permanent damage. If you experience symptoms of nausea, dizziness, chest pain or arm pain  upon initiation of sexual activity after taking this medicine, you should refrain from further activity and call your doctor or health care professional as soon as possible. Do not drink alcohol to excess (examples, 5 glasses of wine or 5 shots of whiskey) when taking this medicine. When taken in excess, alcohol can increase your chances of getting a headache or getting dizzy, increasing your heart rate or lowering your blood pressure. Using this medicine does not protect you or your partner against HIV infection (the virus that causes AIDS) or other sexually transmitted diseases. What side effects may I notice from receiving this medicine? Side effects that you should report to your doctor or health care professional as soon as possible: -allergic reactions like skin rash, itching or hives, swelling of the face, lips, or tongue -breathing problems -changes in hearing -changes in vision -chest pain -fast, irregular heartbeat -prolonged or painful erection -seizures Side effects that usually do not require medical attention (report to your doctor or health care professional if they continue or are bothersome): -back pain -dizziness -flushing -headache -indigestion -muscle aches -nausea -stuffy or runny nose This list may not describe all possible side effects. Call your doctor for medical advice about side effects. You may report side effects to FDA at 1-800-FDA-1088. Where should I keep my  medicine? Keep out of the reach of children. Store at room temperature between 15 and 30 degrees C (59 and 86 degrees F). Throw away any unused medicine after the expiration date. NOTE: This sheet is a summary. It may not cover all possible information. If you have questions about this medicine, talk to your doctor, pharmacist, or health care provider.    2016, Elsevier/Gold Standard. (2014-05-02 13:15:49)    Sildenafil tablets (Viagra) What is this medicine? SILDENAFIL (sil DEN a fil) is used to  treat erection problems in men. This medicine may be used for other purposes; ask your health care provider or pharmacist if you have questions. What should I tell my health care provider before I take this medicine? They need to know if you have any of these conditions: -bleeding disorders -eye or vision problems, including a rare inherited eye disease called retinitis pigmentosa -anatomical deformation of the penis, Peyronie's disease, or history of priapism (painful and prolonged erection) -heart disease, angina, a history of heart attack, irregular heart beats, or other heart problems -high or low blood pressure -history of blood diseases, like sickle cell anemia or leukemia -history of stomach bleeding -kidney disease -liver disease -stroke -an unusual or allergic reaction to sildenafil, other medicines, foods, dyes, or preservatives -pregnant or trying to get pregnant -breast-feeding How should I use this medicine? Take this medicine by mouth with a glass of water. Follow the directions on the prescription label. The dose is usually taken 1 hour before sexual activity. You should not take the dose more than once per day. Do not take your medicine more often than directed. Talk to your pediatrician regarding the use of this medicine in children. This medicine is not used in children for this condition. Overdosage: If you think you have taken too much of this medicine contact a poison control center or emergency room at once. NOTE: This medicine is only for you. Do not share this medicine with others. What if I miss a dose? This does not apply. Do not take double or extra doses. What may interact with this medicine? Do not take this medicine with any of the following medications: -cisapride -methscopolamine nitrate -nitrates like amyl nitrite, isosorbide dinitrate, isosorbide mononitrate, nitroglycerin -nitroprusside -other medicines for erectile dysfunction like avanafil,  tadalafil, vardenafil -riociguat -other sildenafil products (Revatio) This medicine may also interact with the following medications: -certain drugs for high blood pressure -certain drugs for the treatment of HIV infection or AIDS -certain drugs used for fungal or yeast infections, like fluconazole, itraconazole, ketoconazole, and voriconazole -cimetidine -erythromycin -rifampin This list may not describe all possible interactions. Give your health care provider a list of all the medicines, herbs, non-prescription drugs, or dietary supplements you use. Also tell them if you smoke, drink alcohol, or use illegal drugs. Some items may interact with your medicine. What should I watch for while using this medicine? If you notice any changes in your vision while taking this drug, call your doctor or health care professional as soon as possible. Stop using this medicine and call your health care provider right away if you have a loss of sight in one or both eyes. Contact your doctor or health care professional right away if you have an erection that lasts longer than 4 hours or if it becomes painful. This may be a sign of a serious problem and must be treated right away to prevent permanent damage. If you experience symptoms of nausea, dizziness, chest pain or arm pain upon initiation  of sexual activity after taking this medicine, you should refrain from further activity and call your doctor or health care professional as soon as possible. Do not drink alcohol to excess (examples, 5 glasses of wine or 5 shots of whiskey) when taking this medicine. When taken in excess, alcohol can increase your chances of getting a headache or getting dizzy, increasing your heart rate or lowering your blood pressure. Using this medicine does not protect you or your partner against HIV infection (the virus that causes AIDS) or other sexually transmitted diseases. What side effects may I notice from receiving this  medicine? Side effects that you should report to your doctor or health care professional as soon as possible: -allergic reactions like skin rash, itching or hives, swelling of the face, lips, or tongue -breathing problems -changes in hearing -changes in vision -chest pain -fast, irregular heartbeat -prolonged or painful erection -seizures Side effects that usually do not require medical attention (report to your doctor or health care professional if they continue or are bothersome): -back pain -dizziness -flushing -headache -indigestion -muscle aches -nausea -stuffy or runny nose This list may not describe all possible side effects. Call your doctor for medical advice about side effects. You may report side effects to FDA at 1-800-FDA-1088. Where should I keep my medicine? Keep out of reach of children. Store at room temperature between 15 and 30 degrees C (59 and 86 degrees F). Throw away any unused medicine after the expiration date. NOTE: This sheet is a summary. It may not cover all possible information. If you have questions about this medicine, talk to your doctor, pharmacist, or health care provider.    2016, Elsevier/Gold Standard. (2014-05-02 13:19:04)

## 2015-10-10 NOTE — Progress Notes (Signed)
    MRN: [id number] DOB: August 28, 1971  Subjective:   Billy Gomez is a 44 y.o. male presenting for chief complaint of Medication Refill and Cyst  HTN - managed with hydrochlorothiazide. Patient does not check blood pressure at home. Admits that he is trying to eat much healthier and is working to lose weight. 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 Denies lightheadedness, dizziness, chronic headache, double vision, chest pain, shortness of breath, heart racing, palpitations, nausea, vomiting, abdominal pain, hematuria, lower leg swelling.  HL - managed with Lipitor. Diet, exercise and review of systems as above. Denies mental fog or myalgia.  RAD - managed with Advair, albuterol. Takes his Advair daily, uses albuterol very occasionally. Denies shortness of breath, cough, chest tightness, wheezing. He would like a refill for both these medicines today.  ED - patient would like work up for difficulty with obtaining and maintaining erections. He has had increasing difficulty and anxiety with this, reports that his wife has been patient with him. He would like to consider Viagra or Cialis to help with ED.  Denies any other aggravating or relieving factors, no other questions or concerns.  Billy Gomez has a current medication list which includes the following prescription(s): albuterol, atorvastatin, fluticasone-salmeterol, and hydrochlorothiazide. Also has No Known Allergies.  Billy Gomez  has a past medical history of Anxiety and Hypertension. Also  has past surgical history that includes Hernia repair and Spine surgery.  Objective:   Vitals: BP 132/78 mmHg  Pulse 67  Temp(Src) 98.3 F (36.8 C) (Oral)  Resp 16  Ht 6' (1.829 m)  Wt 263 lb (119.296 kg)  BMI 35.66 kg/m2  SpO2 97%  BP Readings from Last 3 Encounters:  10/10/15 132/78  01/27/15 128/86  08/30/14 142/74   Wt Readings from Last 3 Encounters:  10/10/15 263 lb (119.296 kg)  01/27/15 271 lb 9.6 oz (123.197  kg)  08/30/14 258 lb 3.2 oz (117.119 kg)   Physical Exam  Constitutional: He is oriented to person, place, and time. He appears well-developed and well-nourished.  HENT:  Mouth/Throat: Oropharynx is clear and moist.  Eyes: Right eye exhibits no discharge. Left eye exhibits no discharge. No scleral icterus.  Cardiovascular: Normal rate, regular rhythm and intact distal pulses.  Exam reveals no gallop and no friction rub.   No murmur heard. Pulmonary/Chest: No respiratory distress. He has no wheezes. He has no rales.  Abdominal: Soft. Bowel sounds are normal. He exhibits no distension and no mass. There is no tenderness.  Musculoskeletal: He exhibits no edema.  Neurological: He is alert and oriented to person, place, and time.  Skin: Skin is warm and dry. No rash noted. No erythema. No pallor.  Psychiatric: His mood appears anxious (moderately anxious about his health throughout visit).   Assessment and Plan :   1. Essential hypertension - Labs pending - Continue healthy diet and exercise, refill provided for hydrochlorothiazide - Follow up in 6 months  2. RAD (reactive airway disease), mild persistent, uncomplicated - Stable, refills provided for Advair and albuterol  3. Hyperlipidemia - labs pending, will refill cholesterol medication as appropriate  4. Erectile dysfunction, unspecified erectile dysfunction type - Testosterone level pending, patient will discuss trial of Viagra Cialis with his wife and me know by phone.  Wallis BambergMario Christiane Sistare, PA-C [facility] [phone] 10/10/2015 10:43 AM

## 2015-10-12 LAB — COMPREHENSIVE METABOLIC PANEL
ALT: 40 U/L (ref 9–46)
AST: 22 U/L (ref 10–40)
Albumin: 4.6 g/dL (ref 3.6–5.1)
Alkaline Phosphatase: 66 U/L (ref 40–115)
BUN: 23 mg/dL (ref 7–25)
CALCIUM: 9.6 mg/dL (ref 8.6–10.3)
CO2: 28 mmol/L (ref 20–31)
Chloride: 103 mmol/L (ref 98–110)
Creat: 0.81 mg/dL (ref 0.60–1.35)
GLUCOSE: 85 mg/dL (ref 65–99)
POTASSIUM: 3.7 mmol/L (ref 3.5–5.3)
Sodium: 142 mmol/L (ref 135–146)
Total Bilirubin: 0.7 mg/dL (ref 0.2–1.2)
Total Protein: 6.8 g/dL (ref 6.1–8.1)

## 2015-10-12 LAB — LIPID PANEL
CHOL/HDL RATIO: 3.7 ratio (ref <=5.0)
CHOLESTEROL: 159 mg/dL (ref 125–200)
HDL: 43 mg/dL (ref >=40)
LDL CALC: 70 mg/dL (ref <130)
TRIGLYCERIDES: 231 mg/dL — AB (ref <150)
VLDL: 46 mg/dL — AB (ref <30)

## 2015-10-13 ENCOUNTER — Telehealth: Payer: Self-pay | Admitting: Urgent Care

## 2015-10-13 ENCOUNTER — Telehealth: Payer: Self-pay | Admitting: *Deleted

## 2015-10-13 DIAGNOSIS — E782 Mixed hyperlipidemia: Secondary | ICD-10-CM

## 2015-10-13 MED ORDER — ATORVASTATIN CALCIUM 20 MG PO TABS: ORAL_TABLET | ORAL | Status: DC

## 2015-10-13 NOTE — Telephone Encounter (Signed)
Patient called advised Urban GibsonMani instruction patient understood.  Going to do some research and call back for a referral and prescription

## 2015-10-13 NOTE — Telephone Encounter (Signed)
Left message requesting call back. It is unclear if cell phone # on file is the correct line and I could not reach him on his W phone. Please let patient know that his LDL (bad cholesterol) had significant improvement. In ~6 months, patient should have repeat lipid panel. If his triglycerides are still up, patient should consider fenofibrate to help with that. For now, given significant improvement on all fronts, refill of Lipitor is appropriate. Also, his testosterone was low. I generally refer to endocrinology for further work up. There is testosterone replacement therapy but comes with significant risk. If he would like referral, please let me know. Patient is also to let me know about whether or not he wants to try Viagra or Cialis for ED. Lastly, his electrolytes, kidney function, liver enzymes were normal.

## 2016-02-27 ENCOUNTER — Other Ambulatory Visit: Payer: Self-pay | Admitting: Urgent Care

## 2016-03-05 ENCOUNTER — Other Ambulatory Visit: Payer: Self-pay | Admitting: Urgent Care

## 2016-05-22 ENCOUNTER — Other Ambulatory Visit: Payer: Self-pay | Admitting: Physician Assistant

## 2016-07-25 ENCOUNTER — Other Ambulatory Visit: Payer: Self-pay | Admitting: Urgent Care

## 2016-07-25 ENCOUNTER — Other Ambulatory Visit: Payer: Self-pay | Admitting: Family Medicine

## 2016-07-27 ENCOUNTER — Other Ambulatory Visit: Payer: Self-pay | Admitting: Urgent Care

## 2016-07-27 ENCOUNTER — Other Ambulatory Visit: Payer: Self-pay | Admitting: Family Medicine

## 2016-10-18 ENCOUNTER — Ambulatory Visit (INDEPENDENT_AMBULATORY_CARE_PROVIDER_SITE_OTHER): Payer: Managed Care, Other (non HMO) | Admitting: Family Medicine

## 2016-10-18 DIAGNOSIS — E782 Mixed hyperlipidemia: Secondary | ICD-10-CM | POA: Diagnosis not present

## 2016-10-18 DIAGNOSIS — J4530 Mild persistent asthma, uncomplicated: Secondary | ICD-10-CM

## 2016-10-18 DIAGNOSIS — Z9989 Dependence on other enabling machines and devices: Secondary | ICD-10-CM

## 2016-10-18 DIAGNOSIS — I10 Essential (primary) hypertension: Secondary | ICD-10-CM | POA: Diagnosis not present

## 2016-10-18 DIAGNOSIS — G4733 Obstructive sleep apnea (adult) (pediatric): Secondary | ICD-10-CM

## 2016-10-18 MED ORDER — ALBUTEROL SULFATE HFA 108 (90 BASE) MCG/ACT IN AERS: 2.0000 | INHALATION_SPRAY | Freq: Four times a day (QID) | RESPIRATORY_TRACT | 6 refills | Status: DC | PRN

## 2016-10-18 MED ORDER — ALBUTEROL SULFATE HFA 108 (90 BASE) MCG/ACT IN AERS: 2.0000 | INHALATION_SPRAY | Freq: Four times a day (QID) | RESPIRATORY_TRACT | 1 refills | Status: DC | PRN

## 2016-10-18 MED ORDER — HYDROCHLOROTHIAZIDE 12.5 MG PO CAPS: ORAL_CAPSULE | ORAL | 3 refills | Status: DC

## 2016-10-18 MED ORDER — ATORVASTATIN CALCIUM 20 MG PO TABS: ORAL_TABLET | ORAL | 1 refills | Status: DC

## 2016-10-18 MED ORDER — FLUTICASONE-SALMETEROL 100-50 MCG/DOSE IN AEPB: 1.0000 | INHALATION_SPRAY | Freq: Two times a day (BID) | RESPIRATORY_TRACT | 3 refills | Status: DC

## 2016-10-18 NOTE — Patient Instructions (Addendum)
Your blood pressure is elevated today as you have been off medications for the past 2 days. However you may need a stronger dose of the hydrochlorothiazide. Initially start with one pill once per day, then if blood pressures remain over 130/80 in the next week, increase to 2 pills once per day. If you tolerate the higher dose and not lightheaded, dizzy or other new side effects at the higher dose of 25 mg, let me know and I will send in a new prescription.  For your asthma, it appears to be very well controlled on the Advair 250/50, see you will likely be able to tolerate a lower dose of that medication (100/50). New prescription is still one puff twice per day, albuterol if needed for breakthrough asthma symptoms. If you are having to use albuterol more than once or twice a week, or any nighttime symptoms, we can return back to the 250/50 dose.  Return in 6 weeks for lab only visit to look at your cholesterol and liver function once you have been on medication. Follow-up with me in 6 months for repeat visit.  Sign up for my chart today as that'll be the easiest way to communicate with me regarding your blood pressure medicine.   IF you received an x-ray today, you will [facility]. Please contact [facility] at [phone] with questions or concerns regarding your invoice.   IF you received labwork today, you will receive an invoice from [facility]. Please contact [facility] at [phone] with questions or concerns regarding your invoice.   Our billing staff will not be able to assist you with questions regarding bills from these companies.  You will be contacted with the lab results as soon as they are available. The fastest way to get your results is to activate your My Chart account. Instructions are located on the last page of this paperwork. If you have not heard from us regarding the results in 2 weeks, please contact this  office.

## 2016-10-18 NOTE — Progress Notes (Addendum)
By signing my name below, I, Billy Gomez, attest that this documentation has been prepared under the direction and in the presence of [name], MD.  Electronically Signed: [name], Medical Scribe. 10/18/16. 4:37 PM.  Subjective:    Patient ID: Billy Gomez, male    DOB: 22-Feb-1971, 45 y.o.   MRN: [id number]  HPI Chief Complaint  Patient presents with  . Medication Refill    all medications     HPI Comments: Billy Gomez is a 45 y.o. male  who presents to the [facility] for follow-up on asthma, HTN, and HLD.   HTN: Takes HCTZ 12.5 mg. Pt just ran out of his medication 2 days ago. Pt stopped checking his bp since it would always run 130/70. 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 Pt denies light-headedness, dizziness, HA, chest pain ,SOB, dyspnea. Pt deneis stroke, and heart attack. Lab Results  Component Value Date   CREATININE 0.81 10/10/2015   Reactive Airway Disease: Uses advair diskus 250/50. Pt has been out for 2 months and has been using his rescue inhaler 1-2x a week. 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 Pt wears a CPAP at night and he hasn't noticed stopped breathing while sleeping, or SOB while sleeping.  HLD: Takes Lipitor 20 mg. Pt hasn't been taking his medications for the past month/60 days. Pt denies chest pain, chest tightness, myalgias, arthralgias, SOB and other symptoms. Lab Results  Component Value Date   CHOL 159 10/10/2015   HDL 43 10/10/2015   LDLCALC 70 10/10/2015   TRIG 231 (H) 10/10/2015   CHOLHDL 3.7 10/10/2015   Lab Results  Component Value Date   ALT 40 10/10/2015   AST 22 10/10/2015   ALKPHOS 66 10/10/2015   BILITOT 0.7 10/10/2015   Cyst: Pt had a cyst on the left side of his neck removed 2 years ago by, Porfirio Oar, PA-C, but he has noticed it has been gradually getting bigger  over time.   Patient Active Problem List   Diagnosis Date Noted  . Essential hypertension 08/30/2014  . Other and unspecified hyperlipidemia 08/30/2014  . RAD (reactive airway disease) 08/30/2014  . BMI 35.0-35.9,adult 08/30/2014   Past Medical History:  Diagnosis Date  . Anxiety   . Hypertension    Past Surgical History:  Procedure Laterality Date  . HERNIA REPAIR    . SPINE SURGERY     No Known Allergies Prior to Admission medications   Medication Sig Start Date End Date Taking? Authorizing Provider  ADVAIR DISKUS 250-50 MCG/DOSE AEPB INHALE ONE DOSE BY MOUTH TWICE DAILY 02/28/16  Yes Chelle Jeffery, PA-C  ADVAIR DISKUS 250-50 MCG/DOSE AEPB INHALE ONE DOSE BY MOUTH TWICE DAILY 03/06/16  Yes Wallis Bamberg, PA-C  albuterol (PROVENTIL HFA;VENTOLIN HFA) 108 (90 BASE) MCG/ACT inhaler Inhale 2 puffs into the lungs every 6 (six) hours as needed for wheezing or shortness of breath. 10/10/15  Yes Wallis Bamberg, PA-C  atorvastatin (LIPITOR) 20 MG tablet TAKE ONE TABLET BY MOUTH ONCE DAILY. 10/13/15  Yes Wallis Bamberg, PA-C  atorvastatin (LIPITOR) 20 MG tablet TAKE ONE TABLET BY MOUTH ONCE DAILY *OFFICE VISIT NEEDED FOR FURTHER REFILLS* 05/24/16  Yes Ethelda Chick, MD  hydrochlorothiazide (MICROZIDE) 12.5 MG capsule TAKE ONE CAPSULE BY MOUTH ONCE DAILY. 10/10/15  Yes Wallis Bamberg, PA-C   Social History  Social History  . Marital status: Married    Spouse name: N/A  . Number of children: N/A  . Years of education: N/A   Occupational History  . Not on file.   Social History Main Topics  . Smoking status: Former Smoker    Types: Cigarettes  . Smokeless tobacco: Current User    Types: Snuff    Last attempt to quit: 04/28/2012  . Alcohol use No  . Drug use: No  . Sexual activity: Yes    Birth control/ protection: None   Other Topics Concern  . Not on file   Social History Narrative  . No narrative on file   Review of Systems  Constitutional: Negative for fatigue and unexpected weight  change.  Eyes: Negative for visual disturbance.  Respiratory: Negative for cough, chest tightness and shortness of breath.   Cardiovascular: Negative for chest pain, palpitations and leg swelling.  Gastrointestinal: Negative for abdominal pain, blood in stool, constipation and diarrhea.  Musculoskeletal: Negative for arthralgias and myalgias.  Neurological: Negative for dizziness, light-headedness and headaches.   Objective:  Physical Exam  Constitutional: He is oriented to person, place, and time. He appears well-developed and well-nourished.  HENT:  Head: Normocephalic and atraumatic.  Eyes: EOM are normal. Pupils are equal, round, and reactive to light.  Neck: No JVD present. Carotid bruit is not present.  Cardiovascular: Normal rate and regular rhythm.  Exam reveals no gallop and no friction rub.   No murmur heard. Pulmonary/Chest: Effort normal and breath sounds normal. No respiratory distress. He has no wheezes. He has no rales.  Abdominal: Soft. He exhibits no distension. There is no tenderness.  Musculoskeletal: He exhibits no edema.  Neurological: He is alert and oriented to person, place, and time.  Skin: Skin is warm and dry.  Cystic appearing structure on the left lateral neck approx 8mm x 4mm flesh colored, no central opening identified  Psychiatric: He has a normal mood and affect.  Vitals reviewed.  BP (!) 154/100 (BP Location: Right Arm, Patient Position: Sitting, Cuff Size: Large)   Pulse 76   Temp 98.2 F (36.8 C) (Oral)   Resp 18   Ht 6\' 1"  (1.854 m)   Wt 266 lb 9.6 oz (120.9 kg)   SpO2 99%   BMI 35.17 kg/m  Assessment & Plan:   Billy Grosugene E Brunner is a 45 y.o. male OSA on CPAP  - continue CPAP.   Essential hypertension - Plan: hydrochlorothiazide (MICROZIDE) 12.5 MG capsule, COMPLETE METABOLIC PANEL WITH GFR  - Controlled, likely as off meds. Some elevated readings at DOT physical initially 6 months ago when on meds.  -restart HCTZ 12.5 mg daily, then if  over 130/80 on outside readings, increase to 25 mg daily. Call or email mychart if he does start that dose so I can send appropriate refill.  Mixed hyperlipidemia - Plan: atorvastatin (LIPITOR) 20 MG tablet, COMPLETE METABOLIC PANEL WITH GFR, Lipid panel  - Nonadherent to medications recently. Restart Lipitor 20 mg daily, check baseline CMP. Return for fasting lab visit in 6 weeks for CMP and lipid panel. Repeat OV in 6 months.   RAD (reactive airway disease), mild persistent, uncomplicated - Plan: albuterol (PROVENTIL HFA;VENTOLIN HFA) 108 (90 Base) MCG/ACT inhaler, Fluticasone-Salmeterol (ADVAIR) 100-50 MCG/DOSE AEPB  - Asthma/RAD mild persistent. Based on symptoms, likely can be on lower dose ICS/LABA.  Change dose to 100/50 of Advair, albuterol refilled if needed for breakthrough sx's, rtc precautions.   Can follow-up for evaluation of previous  sebaceous cyst area that has now increased in size. Provided name of previous provider who performed this procedure and he can schedule appointment with Chelle if he would like.  Meds ordered this encounter  Medications  . hydrochlorothiazide (MICROZIDE) 12.5 MG capsule    Sig: TAKE ONE CAPSULE BY MOUTH ONCE DAILY.    Dispense:  90 capsule    Refill:  3  . atorvastatin (LIPITOR) 20 MG tablet    Sig: TAKE ONE TABLET BY MOUTH ONCE DAILY.    Dispense:  90 tablet    Refill:  1  . albuterol (PROVENTIL HFA;VENTOLIN HFA) 108 (90 Base) MCG/ACT inhaler    Sig: Inhale 2 puffs into the lungs every 6 (six) hours as needed for wheezing or shortness of breath.    Dispense:  1 Inhaler    Refill:  6  . Fluticasone-Salmeterol (ADVAIR) 100-50 MCG/DOSE AEPB    Sig: Inhale 1 puff into the lungs 2 (two) times daily.    Dispense:  1 each    Refill:  3   Patient Instructions   Your blood pressure is elevated today as you have been off medications for the past 2 days. However you may need a stronger dose of the hydrochlorothiazide. Initially start with one pill  once per day, then if blood pressures remain over 130/80 in the next week, increase to 2 pills once per day. If you tolerate the higher dose and not lightheaded, dizzy or other new side effects at the higher dose of 25 mg, let me know and I will send in a new prescription.  For your asthma, it appears to be very well controlled on the Advair 250/50, see you will likely be able to tolerate a lower dose of that medication (100/50). New prescription is still one puff twice per day, albuterol if needed for breakthrough asthma symptoms. If you are having to use albuterol more than once or twice a week, or any nighttime symptoms, we can return back to the 250/50 dose.  Return in 6 weeks for lab only visit to look at your cholesterol and liver function once you have been on medication. Follow-up with me in 6 months for repeat visit.  Sign up for my chart today as that'll be the easiest way to communicate with me regarding your blood pressure medicine.   IF you received an x-ray today, you will receive an invoice from [facility]. Please contact [facility] at [phone] with questions or concerns regarding your invoice.   IF you received labwork today, you will receive an invoice from [employer]. Please contact [facility] at [phone] with questions or concerns regarding your invoice.   Our billing staff will not be able to assist you with questions regarding bills from these companies.  You will be contacted with the lab results as soon as they are available. The fastest way to get your results is to activate your My Chart account. Instructions are located on the last page of this paperwork. If you have not heard from [place] regarding the results in 2 weeks, please contact this office.        I personally performed the services described in this documentation, which was scribed in my presence. The recorded information has been reviewed and considered, and  addended by me as needed.   Signed,   [name], MD [facility].  10/18/16 5:01 PM

## 2016-10-19 LAB — COMPLETE METABOLIC PANEL WITH GFR
ALK PHOS: 60 U/L (ref 40–115)
ALT: 38 U/L (ref 9–46)
AST: 28 U/L (ref 10–40)
Albumin: 4.5 g/dL (ref 3.6–5.1)
BUN: 20 mg/dL (ref 7–25)
CHLORIDE: 105 mmol/L (ref 98–110)
CO2: 26 mmol/L (ref 20–31)
CREATININE: 0.86 mg/dL (ref 0.60–1.35)
Calcium: 9.3 mg/dL (ref 8.6–10.3)
GFR, Est African American: >89 mL/min (ref >=60)
Glucose, Bld: 83 mg/dL (ref 65–99)
Potassium: 4 mmol/L (ref 3.5–5.3)
SODIUM: 142 mmol/L (ref 135–146)
TOTAL PROTEIN: 6.8 g/dL (ref 6.1–8.1)
Total Bilirubin: 0.9 mg/dL (ref 0.2–1.2)

## 2017-02-15 ENCOUNTER — Other Ambulatory Visit: Payer: Managed Care, Other (non HMO)

## 2017-02-18 ENCOUNTER — Other Ambulatory Visit: Payer: Managed Care, Other (non HMO)

## 2017-02-18 DIAGNOSIS — E782 Mixed hyperlipidemia: Secondary | ICD-10-CM

## 2017-02-19 LAB — COMPREHENSIVE METABOLIC PANEL
ALK PHOS: 71 IU/L (ref 39–117)
ALT: 49 IU/L — AB (ref 0–44)
AST: 23 IU/L (ref 0–40)
Albumin/Globulin Ratio: 2.5 — ABNORMAL HIGH (ref 1.2–2.2)
Albumin: 5 g/dL (ref 3.5–5.5)
BILIRUBIN TOTAL: 0.9 mg/dL (ref 0.0–1.2)
BUN/Creatinine Ratio: 26 — ABNORMAL HIGH (ref 9–20)
BUN: 17 mg/dL (ref 6–24)
CHLORIDE: 100 mmol/L (ref 96–106)
CO2: 23 mmol/L (ref 18–29)
Calcium: 9.7 mg/dL (ref 8.7–10.2)
Creatinine, Ser: 0.66 mg/dL — ABNORMAL LOW (ref 0.76–1.27)
GFR calc Af Amer: 133 mL/min/{1.73_m2} (ref >59)
GFR calc non Af Amer: 115 mL/min/{1.73_m2} (ref >59)
GLUCOSE: 90 mg/dL (ref 65–99)
Globulin, Total: 2 g/dL (ref 1.5–4.5)
Potassium: 3.8 mmol/L (ref 3.5–5.2)
Sodium: 142 mmol/L (ref 134–144)
TOTAL PROTEIN: 7 g/dL (ref 6.0–8.5)

## 2017-02-19 LAB — LIPID PANEL
CHOLESTEROL TOTAL: 168 mg/dL (ref 100–199)
Chol/HDL Ratio: 2.9 ratio units (ref 0.0–5.0)
HDL: 58 mg/dL (ref >39)
LDL Calculated: 90 mg/dL (ref 0–99)
TRIGLYCERIDES: 101 mg/dL (ref 0–149)
VLDL CHOLESTEROL CAL: 20 mg/dL (ref 5–40)

## 2017-02-25 ENCOUNTER — Ambulatory Visit: Payer: Managed Care, Other (non HMO)

## 2017-04-11 ENCOUNTER — Other Ambulatory Visit: Payer: Self-pay | Admitting: Family Medicine

## 2017-04-11 DIAGNOSIS — E782 Mixed hyperlipidemia: Secondary | ICD-10-CM

## 2017-04-13 ENCOUNTER — Ambulatory Visit (INDEPENDENT_AMBULATORY_CARE_PROVIDER_SITE_OTHER): Payer: Managed Care, Other (non HMO) | Admitting: Physician Assistant

## 2017-04-13 ENCOUNTER — Encounter: Payer: Self-pay | Admitting: Physician Assistant

## 2017-04-13 VITALS — BP 148/92 | HR 74 | Temp 98.1°F | Resp 16 | Ht 73.0 in | Wt 266.0 lb

## 2017-04-13 DIAGNOSIS — I10 Essential (primary) hypertension: Secondary | ICD-10-CM | POA: Diagnosis not present

## 2017-04-13 DIAGNOSIS — J4530 Mild persistent asthma, uncomplicated: Secondary | ICD-10-CM

## 2017-04-13 DIAGNOSIS — E782 Mixed hyperlipidemia: Secondary | ICD-10-CM | POA: Diagnosis not present

## 2017-04-13 MED ORDER — ATORVASTATIN CALCIUM 20 MG PO TABS: 20.0000 mg | ORAL_TABLET | Freq: Every day | ORAL | 3 refills | Status: DC

## 2017-04-13 MED ORDER — HYDROCHLOROTHIAZIDE 25 MG PO TABS: 25.0000 mg | ORAL_TABLET | Freq: Every day | ORAL | 0 refills | Status: DC

## 2017-04-13 MED ORDER — ALBUTEROL SULFATE HFA 108 (90 BASE) MCG/ACT IN AERS: 2.0000 | INHALATION_SPRAY | Freq: Four times a day (QID) | RESPIRATORY_TRACT | 1 refills | Status: DC | PRN

## 2017-04-13 MED ORDER — LISINOPRIL 10 MG PO TABS: 10.0000 mg | ORAL_TABLET | Freq: Every day | ORAL | 0 refills | Status: DC

## 2017-04-13 NOTE — Progress Notes (Signed)
MRN: [id number] DOB: Mar 12, 1971  Subjective:   Billy Gomez is a 46 y.o. male presenting for medication refill.   HTN: Currently managed with HCTZ 61m. He is prescribed HCTZ 12.532m which was restarted by Dr. GrCarlota Raspberryt 10/18/16. However, his bp readings have been constantly >140/90 on the 12.5 mg so he increased his dose to 2544m Patient is checking blood pressure at home, range is [id number]. Reports no side effects. Denies lightheadedness, dizziness, chronic headache, double vision, chest pain, shortness of breath, heart racing, palpitations, nausea, vomiting, abdominal pain, hematuria, lower leg swelling.Diet consists of cereal, salad, chips, chicken, lean pork chops, cheeseburger. He drinks caffienated beverages, drinks a lot of water. Does not do structured exercise. Does not smoke tobacco but chews tobacco. Occasional alcohol.   RAD - Takes his Advair daily, uses albuterol very occasionally. Notes he lost his last albuterol inhaler and needs a refill Denies shortness of breath, cough, chest tightness, wheezing. He would like a refill for both these medicines today.  HLD: Takes Lipitor 20 mg. He has been out of medication for 2 days. Last lipid panel was 01/2017, his triglyercides and cholesterol were well ocntrolled at that time. Pt denies chest pain, chest tightness, myalgias, arthralgias, SOB and other symptoms.  EugRollans a current medication list which includes the following prescription(s): albuterol, atorvastatin, fluticasone-salmeterol, and hydrochlorothiazide. Also has No Known Allergies.  EugMelroyas a past medical history of Anxiety and Hypertension. Also  has a past surgical history that includes Hernia repair and Spine surgery.   Objective:   Vitals: BP (!) 148/92 (BP Location: Left Arm, Cuff Size: Large)   Pulse 74   Temp 98.1 F (36.7 C) (Oral)   Resp 16   Ht '6\' 1"'  (1.854 m)   Wt 266 lb (120.7 kg)   SpO2 98%   BMI 35.09 kg/m   Physical Exam    Constitutional: He is oriented to person, place, and time. He appears well-developed and well-nourished. No distress.  HENT:  Head: Normocephalic and atraumatic.  Eyes: Conjunctivae are normal.  Neck: Normal range of motion.  Cardiovascular: Normal rate, regular rhythm and intact distal pulses.   Pulmonary/Chest: Effort normal and breath sounds normal. He has no wheezes. He has no rhonchi. He has no rales.  Musculoskeletal:       Right lower leg: He exhibits no swelling.       Left lower leg: He exhibits no swelling.  Neurological: He is alert and oriented to person, place, and time.  Skin: Skin is warm and dry.  Psychiatric: He has a normal mood and affect.  Vitals reviewed.    BP Readings from Last 3 Encounters:  04/13/17 (!) 148/92  10/18/16 (!) 154/100  10/10/15 132/78    No results found for this or any previous visit (from the past 24 hour(s)).  Assessment and Plan :  This is my first time meeting this patient.  1. Essential hypertension Uncontrolled in office today. Will add 2nd agent to pt's bp regimen. Instructed to continue checking bp outside the office. His goal is <140/90 and >100/60. I have also scheduled him an appointment with Dr. GreCarlota Raspberry 2 weeks (04/27/17 at 5pm) for follow up on bp since an additional agent was added today.  - CMP14+EGFR - lisinopril (PRINIVIL,ZESTRIL) 10 MG tablet; Take 1 tablet (10 mg total) by mouth daily.  Dispense: 30 tablet; Refill: 0 - hydrochlorothiazide (HYDRODIURIL) 25 MG tablet; Take 1 tablet (25 mg total) by mouth daily.  Dispense: 30 tablet; Refill: 0  2. RAD (reactive airway disease), mild persistent, uncomplicated Well controlled.  - albuterol (PROAIR HFA) 108 (90 Base) MCG/ACT inhaler; Inhale 2 puffs into the lungs every 6 (six) hours as needed for wheezing or shortness of breath.  Dispense: 1 Inhaler; Refill: 1  3. Mixed hyperlipidemia Well controlled.  - atorvastatin (LIPITOR) 20 MG tablet; Take 1 tablet (20 mg total) by  mouth daily.  Dispense: 90 tablet; Refill: [place], PA-C  [facility] 04/13/2017 8:24 AM

## 2017-04-13 NOTE — Patient Instructions (Addendum)
For HTN: Please take both HCTZ  daily and lisinopril  daily. Check your blood pressure outside of the office. Your goal is <140/90 and >100/60. If your values are consistently outside of this range, please call our office. You are scheduled to see Dr. Neva Gomez in 2 weeks on May 3rd at 5pm at [facility] [address]. At this time, he will recheck your bp and see if your medication needs to be adjusted. Please let me know if you have any questions/concerns.  For HLD I have given you enough refills for lipitor to last one year.   For asthma, I have given you 2 refills for albuterol.   Thank you for letting me participate in your health and well being.   DASH Eating Plan DASH stands for "Dietary Approaches to Stop Hypertension." The DASH eating plan is a healthy eating plan that has been shown to reduce high blood pressure (hypertension). It may also reduce your risk for type 2 diabetes, heart disease, and stroke. The DASH eating plan may also help with weight loss. What are tips for following this plan? General guidelines   Avoid eating more than 2,300 mg (milligrams) of salt (sodium) a day. If you have hypertension, you may need to reduce your sodium intake to 1,500 mg a day.  Limit alcohol intake to no more than 1 drink a day for nonpregnant women and 2 drinks a day for men. One drink equals 12 oz of beer, 5 oz of wine, or 1 oz of hard liquor.  Work with your health care provider to maintain a healthy body weight or to lose weight. Ask what an ideal weight is for you.  Get at least 30 minutes of exercise that causes your heart to beat faster (aerobic exercise) most days of the week. Activities may include walking, swimming, or biking.  Work with your health care provider or diet and nutrition specialist (dietitian) to adjust your eating plan to your individual calorie needs. Reading food labels   Check food labels for the amount of sodium per serving. Choose foods with less than 5 percent of  the Daily Value of sodium. Generally, foods with less than 300 mg of sodium per serving fit into this eating plan.  To find whole grains, look for the word "whole" as the first word in the ingredient list. Shopping   Buy products labeled as "low-sodium" or "no salt added."  Buy fresh foods. Avoid canned foods and premade or frozen meals. Cooking   Avoid adding salt when cooking. Use salt-free seasonings or herbs instead of table salt or sea salt. Check with your health care provider or pharmacist before using salt substitutes.  Do not fry foods. Cook foods using healthy methods such as baking, boiling, grilling, and broiling instead.  Cook with heart-healthy oils, such as olive, canola, soybean, or sunflower oil. Meal planning    Eat a balanced diet that includes:  5 or more servings of fruits and vegetables each day. At each meal, try to fill half of your plate with fruits and vegetables.  Up to 6-8 servings of whole grains each day.  Less than 6 oz of lean meat, poultry, or fish each day. A 3-oz serving of meat is about the same size as a deck of cards. One egg equals 1 oz.  2 servings of low-fat dairy each day.  A serving of nuts, seeds, or beans 5 times each week.  Heart-healthy fats. Healthy fats called Omega-3 fatty acids are found in foods such  as flaxseeds and coldwater fish, like sardines, salmon, and mackerel.  Limit how much you eat of the following:  Canned or prepackaged foods.  Food that is high in trans fat, such as fried foods.  Food that is high in saturated fat, such as fatty meat.  Sweets, desserts, sugary drinks, and other foods with added sugar.  Full-fat dairy products.  Do not salt foods before eating.  Try to eat at least 2 vegetarian meals each week.  Eat more home-cooked food and less restaurant, buffet, and fast food.  When eating at a restaurant, ask that your food be prepared with less salt or no salt, if possible. What foods are  recommended? The items listed may not be a complete list. Talk with your dietitian about what dietary choices are best for you. Grains  Whole-grain or whole-wheat bread. Whole-grain or whole-wheat pasta. Brown rice. Billy Gomez. Bulgur. Whole-grain and low-sodium cereals. Pita bread. Low-fat, low-sodium crackers. Whole-wheat flour tortillas. Vegetables  Fresh or frozen vegetables (raw, steamed, roasted, or grilled). Low-sodium or reduced-sodium tomato and vegetable juice. Low-sodium or reduced-sodium tomato sauce and tomato paste. Low-sodium or reduced-sodium canned vegetables. Fruits  All fresh, dried, or frozen fruit. Canned fruit in natural juice (without added sugar). Meat and other protein foods  Skinless chicken or Billy Gomez. Ground chicken or Billy Gomez. Pork with fat trimmed off. Fish and seafood. Egg whites. Dried beans, peas, or lentils. Unsalted nuts, nut butters, and seeds. Unsalted canned beans. Lean cuts of beef with fat trimmed off. Low-sodium, lean deli meat. Dairy  Low-fat (1%) or fat-free (skim) milk. Fat-free, low-fat, or reduced-fat cheeses. Nonfat, low-sodium ricotta or cottage cheese. Low-fat or nonfat yogurt. Low-fat, low-sodium cheese. Fats and oils  Soft margarine without trans fats. Vegetable oil. Low-fat, reduced-fat, or light mayonnaise and salad dressings (reduced-sodium). Canola, safflower, olive, soybean, and sunflower oils. Avocado. Seasoning and other foods  Herbs. Spices. Seasoning mixes without salt. Unsalted popcorn and pretzels. Fat-free sweets. What foods are not recommended? The items listed may not be a complete list. Talk with your dietitian about what dietary choices are best for you. Grains  Baked goods made with fat, such as croissants, muffins, or some breads. Dry pasta or rice meal packs. Vegetables  Creamed or fried vegetables. Vegetables in a cheese sauce. Regular canned vegetables (not low-sodium or reduced-sodium). Regular canned tomato sauce and  paste (not low-sodium or reduced-sodium). Regular tomato and vegetable juice (not low-sodium or reduced-sodium). Billy Gomez. Billy Gomez. Fruits  Canned fruit in a light or heavy syrup. Fried fruit. Fruit in cream or butter sauce. Meat and other protein foods  Fatty cuts of meat. Ribs. Fried meat. Tomasa Blase. Sausage. Bologna and other processed lunch meats. Salami. Fatback. Hotdogs. Bratwurst. Salted nuts and seeds. Canned beans with added salt. Canned or smoked fish. Whole eggs or egg yolks. Chicken or Billy Gomez with skin. Dairy  Whole or 2% milk, cream, and half-and-half. Whole or full-fat cream cheese. Whole-fat or sweetened yogurt. Full-fat cheese. Nondairy creamers. Whipped toppings. Processed cheese and cheese spreads. Fats and oils  Butter. Stick margarine. Lard. Shortening. Ghee. Bacon fat. Tropical oils, such as coconut, palm kernel, or palm oil. Seasoning and other foods  Salted popcorn and pretzels. Onion salt, garlic salt, seasoned salt, table salt, and sea salt. Worcestershire sauce. Tartar sauce. Barbecue sauce. Teriyaki sauce. Soy sauce, including reduced-sodium. Steak sauce. Canned and packaged gravies. Fish sauce. Oyster sauce. Cocktail sauce. Horseradish that you find on the shelf. Ketchup. Mustard. Meat flavorings and tenderizers. Bouillon cubes. Hot sauce and [place]  sauce. Premade or packaged marinades. Premade or packaged taco seasonings. Relishes. Regular salad dressings. Where to find more information:  National Heart, Lung, and Blood Institute: PopSteam.is  American Heart Association: www.heart.org Summary  The DASH eating plan is a healthy eating plan that has been shown to reduce high blood pressure (hypertension). It may also reduce your risk for type 2 diabetes, heart disease, and stroke.  With the DASH eating plan, you should limit salt (sodium) intake to 2,300 mg a day. If you have hypertension, you may need to reduce your sodium intake to 1,500 mg a day.  When on the DASH  eating plan, aim to eat more fresh fruits and vegetables, whole grains, lean proteins, low-fat dairy, and heart-healthy fats.  Work with your health care provider or diet and nutrition specialist (dietitian) to adjust your eating plan to your individual calorie needs. This information is not intended to replace advice given to you by your health care provider. Make sure you discuss any questions you have with your health care provider. Document Released: 12/01/2011 Document Revised: 12/05/2016 Document Reviewed: 12/05/2016 Elsevier Interactive Patient Education  2017 [employer]  Lisinopril tablets What is this medicine? LISINOPRIL (lyse IN oh pril) is an ACE inhibitor. This medicine is used to treat high blood pressure and heart failure. It is also used to protect the heart immediately after a heart attack. This medicine may be used for other purposes; ask your health care provider or pharmacist if you have questions. COMMON BRAND NAME(S): Prinivil, Zestril What should I tell my health care provider before I take this medicine? They need to know if you have any of these conditions: -diabetes -heart or blood vessel disease -kidney disease -low blood pressure -previous swelling of the tongue, face, or lips with difficulty breathing, difficulty swallowing, hoarseness, or tightening of the throat -an unusual or allergic reaction to lisinopril, other ACE inhibitors, insect venom, foods, dyes, or preservatives -pregnant or trying to get pregnant -breast-feeding How should I use this medicine? Take this medicine by mouth with a glass of water. Follow the directions on your prescription label. You may take this medicine with or without food. If it upsets your stomach, take it with food. Take your medicine at regular intervals. Do not take it more often than directed. Do not stop taking except on your doctor's advice. Talk to your pediatrician regarding the use of this medicine in children.  Special care may be needed. While this drug may be prescribed for children as young as 58 years of age for selected conditions, precautions do apply. Overdosage: If you think you have taken too much of this medicine contact a poison control center or emergency room at once. NOTE: This medicine is only for you. Do not share this medicine with others. What if I miss a dose? If you miss a dose, take it as soon as you can. If it is almost time for your next dose, take only that dose. Do not take double or extra doses. What may interact with this medicine? Do not take this medicine with any of the following medications: -hymenoptera venom -sacubitril; valsartan This medicines may also interact with the following medications: -aliskiren -angiotensin receptor blockers, like losartan or valsartan -certain medicines for diabetes -diuretics -everolimus -gold compounds -lithium -NSAIDs, medicines for pain and inflammation, like ibuprofen or naproxen -potassium salts or supplements -salt substitutes -sirolimus -temsirolimus This list may not describe all possible interactions. Give your health care provider a list of all the medicines, herbs, non-prescription  drugs, or dietary supplements you use. Also tell them if you smoke, drink alcohol, or use illegal drugs. Some items may interact with your medicine. What should I watch for while using this medicine? Visit your doctor or health care professional for regular check ups. Check your blood pressure as directed. Ask your doctor what your blood pressure should be, and when you should contact him or her. Do not treat yourself for coughs, colds, or pain while you are using this medicine without asking your doctor or health care professional for advice. Some ingredients may increase your blood pressure. Women should inform their doctor if they wish to become pregnant or think they might be pregnant. There is a potential for serious side effects to an unborn  child. Talk to your health care professional or pharmacist for more information. Check with your doctor or health care professional if you get an attack of severe diarrhea, nausea and vomiting, or if you sweat a lot. The loss of too much body fluid can make it dangerous for you to take this medicine. You may get drowsy or dizzy. Do not drive, use machinery, or do anything that needs mental alertness until you know how this drug affects you. Do not stand or sit up quickly, especially if you are an older patient. This reduces the risk of dizzy or fainting spells. Alcohol can make you more drowsy and dizzy. Avoid alcoholic drinks. Avoid salt substitutes unless you are told otherwise by your doctor or health care professional. What side effects may I notice from receiving this medicine? Side effects that you should report to your doctor or health care professional as soon as possible: -allergic reactions like skin rash, itching or hives, swelling of the hands, feet, face, lips, throat, or tongue -breathing problems -signs and symptoms of kidney injury like trouble passing urine or change in the amount of urine -signs and symptoms of increased potassium like muscle weakness; chest pain; or fast, irregular heartbeat -signs and symptoms of liver injury like dark yellow or brown urine; general ill feeling or flu-like symptoms; light-colored stools; loss of appetite; nausea; right upper belly pain; unusually weak or tired; yellowing of the eyes or skin -signs and symptoms of low blood pressure like dizziness; feeling faint or lightheaded, falls; unusually weak or tired -stomach pain with or without nausea and vomiting Side effects that usually do not require medical attention (report to your doctor or health care professional if they continue or are bothersome): -changes in taste -cough -dizziness -fever -headache -sensitivity to light This list may not describe all possible side effects. Call your doctor  for medical advice about side effects. You may report side effects to FDA at 1-800-FDA-1088. Where should I keep my medicine? Keep out of the reach of children. Store at room temperature between 15 and 30 degrees C (59 and 86 degrees F). Protect from moisture. Keep container tightly closed. Throw away any unused medicine after the expiration date. NOTE: This sheet is a summary. It may not cover all possible information. If you have questions about this medicine, talk to your doctor, pharmacist, or health care provider.  2018 Elsevier/Gold Standard (2016-02-01 12:52:35)  IF you received an x-ray today, you will receive an invoice from [facility]. Please contact [facility] at [phone] with questions or concerns regarding your invoice.   IF you received labwork today, you will receive an invoice from [facility]. Please contact [facility] at [phone] with questions or concerns regarding your invoice.   Our billing staff  will not be able to assist you with questions regarding bills from these companies.  You will be contacted with the lab results as soon as they are available. The fastest way to get your results is to activate your My Chart account. Instructions are located on the last page of this paperwork. If you have not heard from [place] regarding the results in 2 weeks, please contact this office.

## 2017-04-14 LAB — CMP14+EGFR
ALK PHOS: 76 IU/L (ref 39–117)
ALT: 49 IU/L — ABNORMAL HIGH (ref 0–44)
AST: 31 IU/L (ref 0–40)
Albumin/Globulin Ratio: 2.2 (ref 1.2–2.2)
Albumin: 4.7 g/dL (ref 3.5–5.5)
BUN/Creatinine Ratio: 28 — ABNORMAL HIGH (ref 9–20)
BUN: 23 mg/dL (ref 6–24)
Bilirubin Total: 1.1 mg/dL (ref 0.0–1.2)
CALCIUM: 9.4 mg/dL (ref 8.7–10.2)
CO2: 24 mmol/L (ref 18–29)
CREATININE: 0.81 mg/dL (ref 0.76–1.27)
Chloride: 100 mmol/L (ref 96–106)
GFR calc Af Amer: 122 mL/min/{1.73_m2} (ref >59)
GFR calc non Af Amer: 106 mL/min/{1.73_m2} (ref >59)
GLOBULIN, TOTAL: 2.1 g/dL (ref 1.5–4.5)
Glucose: 93 mg/dL (ref 65–99)
POTASSIUM: 3.8 mmol/L (ref 3.5–5.2)
Sodium: 140 mmol/L (ref 134–144)
TOTAL PROTEIN: 6.8 g/dL (ref 6.0–8.5)

## 2017-04-27 ENCOUNTER — Ambulatory Visit (INDEPENDENT_AMBULATORY_CARE_PROVIDER_SITE_OTHER): Payer: Managed Care, Other (non HMO) | Admitting: Family Medicine

## 2017-04-27 ENCOUNTER — Encounter: Payer: Self-pay | Admitting: Family Medicine

## 2017-04-27 VITALS — BP 140/88 | HR 81 | Temp 99.0°F | Resp 16 | Ht 71.5 in | Wt 262.8 lb

## 2017-04-27 DIAGNOSIS — J4520 Mild intermittent asthma, uncomplicated: Secondary | ICD-10-CM | POA: Diagnosis not present

## 2017-04-27 DIAGNOSIS — R945 Abnormal results of liver function studies: Secondary | ICD-10-CM

## 2017-04-27 DIAGNOSIS — I10 Essential (primary) hypertension: Secondary | ICD-10-CM

## 2017-04-27 DIAGNOSIS — R7989 Other specified abnormal findings of blood chemistry: Secondary | ICD-10-CM | POA: Diagnosis not present

## 2017-04-27 NOTE — Patient Instructions (Addendum)
Avoid excessive alcohol use as that can elevate blood pressure.   If blood pressure remains over 140/90 into next week - increase to 2 lisinopril each day (total dose of 20mg  once per day).  If you do end up switching to that dose, let me know and I will send in a new prescription that is a combo pill of 20mg  lisinopril and 25mg  of HCTZ.   One liver test was elevated previously.  Watch diet, work on weight and try to decrease alcohol use. Recheck that level in 3 months. You can also bring a copy of your bloodwork from work as that may include those readings.   Recheck with me in 3 months.   Advair up to twice per day for asthma, albuterol inhaler if needed.   Return to the clinic or go to the nearest emergency room if any of your symptoms worsen or new symptoms occur.  Hypertension Hypertension, commonly called high blood pressure, is when the force of blood pumping through the arteries is too strong. The arteries are the blood vessels that carry blood from the heart throughout the body. Hypertension forces the heart to work harder to pump blood and may cause arteries to become narrow or stiff. Having untreated or uncontrolled hypertension can cause heart attacks, strokes, kidney disease, and other problems. A blood pressure reading consists of a higher number over a lower number. Ideally, your blood pressure should be below 120/80. The first ("top") number is called the systolic pressure. It is a measure of the pressure in your arteries as your heart beats. The second ("bottom") number is called the diastolic pressure. It is a measure of the pressure in your arteries as the heart relaxes. What are the causes? The cause of this condition is not known. What increases the risk? Some risk factors for high blood pressure are under your control. Others are not. Factors you can change   Smoking.  Having type 2 diabetes mellitus, high cholesterol, or both.  Not getting enough exercise or physical  activity.  Being overweight.  Having too much fat, sugar, calories, or salt (sodium) in your diet.  Drinking too much alcohol. Factors that are difficult or impossible to change   Having chronic kidney disease.  Having a family history of high blood pressure.  Age. Risk increases with age.  Race. You may be at higher risk if you are African-American.  Gender. Men are at higher risk than women before age 545. After age 48, women are at higher risk than men.  Having obstructive sleep apnea.  Stress. What are the signs or symptoms? Extremely high blood pressure (hypertensive crisis) may cause:  Headache.  Anxiety.  Shortness of breath.  Nosebleed.  Nausea and vomiting.  Severe chest pain.  Jerky movements you cannot control (seizures). How is this diagnosed? This condition is diagnosed by measuring your blood pressure while you are seated, with your arm resting on a surface. The cuff of the blood pressure monitor will be placed directly against the skin of your upper arm at the level of your heart. It should be measured at least twice using the same arm. Certain conditions can cause a difference in blood pressure between your right and left arms. Certain factors can cause blood pressure readings to be lower or higher than normal (elevated) for a short period of time:  When your blood pressure is higher when you are in a health care provider's office than when you are at home, this is called white  coat hypertension. Most people with this condition do not need medicines.  When your blood pressure is higher at home than when you are in a health care provider's office, this is called masked hypertension. Most people with this condition may need medicines to control blood pressure. If you have a high blood pressure reading during one visit or you have normal blood pressure with other risk factors:  You may be asked to return on a different day to have your blood pressure checked  again.  You may be asked to monitor your blood pressure at home for 1 week or longer. If you are diagnosed with hypertension, you may have other blood or imaging tests to help your health care provider understand your overall risk for other conditions. How is this treated? This condition is treated by making healthy lifestyle changes, such as eating healthy foods, exercising more, and reducing your alcohol intake. Your health care provider may prescribe medicine if lifestyle changes are not enough to get your blood pressure under control, and if:  Your systolic blood pressure is above 130.  Your diastolic blood pressure is above 80. Your personal target blood pressure may vary depending on your medical conditions, your age, and other factors. Follow these instructions at home: Eating and drinking   Eat a diet that is high in fiber and potassium, and low in sodium, added sugar, and fat. An example eating plan is called the DASH (Dietary Approaches to Stop Hypertension) diet. To eat this way:  Eat plenty of fresh fruits and vegetables. Try to fill half of your plate at each meal with fruits and vegetables.  Eat whole grains, such as whole wheat pasta, brown rice, or whole grain bread. Fill about one quarter of your plate with whole grains.  Eat or drink low-fat dairy products, such as skim milk or low-fat yogurt.  Avoid fatty cuts of meat, processed or cured meats, and poultry with skin. Fill about one quarter of your plate with lean proteins, such as fish, chicken without skin, beans, eggs, and tofu.  Avoid premade and processed foods. These tend to be higher in sodium, added sugar, and fat.  Reduce your daily sodium intake. Most people with hypertension should eat less than 1,500 mg of sodium a day.  Limit alcohol intake to no more than 1 drink a day for nonpregnant women and 2 drinks a day for men. One drink equals 12 oz of beer, 5 oz of wine, or 1 oz of hard liquor. Lifestyle   Work  with your health care provider to maintain a healthy body weight or to lose weight. Ask what an ideal weight is for you.  Get at least 30 minutes of exercise that causes your heart to beat faster (aerobic exercise) most days of the week. Activities may include walking, swimming, or biking.  Include exercise to strengthen your muscles (resistance exercise), such as pilates or lifting weights, as part of your weekly exercise routine. Try to do these types of exercises for 30 minutes at least 3 days a week.  Do not use any products that contain nicotine or tobacco, such as cigarettes and e-cigarettes. If you need help quitting, ask your health care provider.  Monitor your blood pressure at home as told by your health care provider.  Keep all follow-up visits as told by your health care provider. This is important. Medicines   Take over-the-counter and prescription medicines only as told by your health care provider. Follow directions carefully. Blood pressure medicines  must be taken as prescribed.  Do not skip doses of blood pressure medicine. Doing this puts you at risk for problems and can make the medicine less effective.  Ask your health care provider about side effects or reactions to medicines that you should watch for. Contact a health care provider if:  You think you are having a reaction to a medicine you are taking.  You have headaches that keep coming back (recurring).  You feel dizzy.  You have swelling in your ankles.  You have trouble with your vision. Get help right away if:  You develop a severe headache or confusion.  You have unusual weakness or numbness.  You feel faint.  You have severe pain in your chest or abdomen.  You vomit repeatedly.  You have trouble breathing. Summary  Hypertension is when the force of blood pumping through your arteries is too strong. If this condition is not controlled, it may put you at risk for serious complications.  Your  personal target blood pressure may vary depending on your medical conditions, your age, and other factors. For most people, a normal blood pressure is less than 120/80.  Hypertension is treated with lifestyle changes, medicines, or a combination of both. Lifestyle changes include weight loss, eating a healthy, low-sodium diet, exercising more, and limiting alcohol. This information is not intended to replace advice given to you by your health care provider. Make sure you discuss any questions you have with your health care provider. Document Released: 12/12/2005 Document Revised: 11/09/2016 Document Reviewed: 11/09/2016 Elsevier Interactive Patient Education  2017 [employer].    IF you received an x-ray today, you will receive an invoice from [facility]. Please contact [facility] at [phone] with questions or concerns regarding your invoice.   IF you received labwork today, you will receive an invoice from [facility]. Please contact [facility] at [phone] with questions or concerns regarding your invoice.   Our billing staff will not be able to assist you with questions regarding bills from these companies.  You will be contacted with the lab results as soon as they are available. The fastest way to get your results is to activate your My Chart account. Instructions are located on the last page of this paperwork. If you have not heard from [place] regarding the results in 2 weeks, please contact this office.

## 2017-04-27 NOTE — Progress Notes (Signed)
By signing my name below, I, Billy Gomez, attest that this documentation has been prepared under the direction and in the presence of [name], MD.  Electronically Signed: [name], Medical Scribe. 04/27/17. 5:40 PM.  Subjective:    Patient ID: Billy Gomez, male    DOB: [birth year], 46 y.o.   MRN: [id number]  HPI Chief Complaint  Patient presents with  . Follow-up    BLOOD PRESSURE and CHOLESTEROL  . Medication Refill    ADVAIR    HPI Comments: Billy Gomez is a 46 y.o. male who presents to Primary Care at [facility] for follow-up.  Pt plans on getting lab work from work 05/04/17 or 05/11/17.  HTN with OSA on CPAP: He was last seen 04/13/17 by [name], PA-C, still elevated on 12.5 mg dose, so incresed to 25 mg. BP 148/92 at that visit; added lisinopril 20 mg QD.  Pt is compliant with medication and his bp has been measuring 160/90 1.5 hours after he eats lunch - no readings under 140/80. Pt continues to used his CPAP machine. Pt drinks coffee 2x a day, and drinks every other weekend, not daily. Denies chest pain, chest tightness, dizziness, light-headedness, difficulty urinating, or other acute side effects. Lab Results  Component Value Date   CREATININE 0.81 04/13/2017   BP Readings from Last 3 Encounters:  04/27/17 140/88  04/13/17 (!) 148/92  10/18/16 (!) 154/100   HLD: Tolerating lipitor 20 mg QD. Lab Results  Component Value Date   CHOL 168 02/18/2017   HDL 58 02/18/2017   LDLCALC 90 02/18/2017   TRIG 101 02/18/2017   CHOLHDL 2.9 02/18/2017   Increased LFTs: Lab Results  Component Value Date   ALT 49 (H) 04/13/2017   AST 31 04/13/2017   ALKPHOS 76 04/13/2017   BILITOT 1.1 04/13/2017   Reactive airway: Well controlled on advair as well as albuterol PRN. Pt usually takes advair QD. Reports feeling chest pressure the other day and found relief with advair. Denies flares, chest pressure, chest pain, chest tightness, or other acute  sxs.  Patient Active Problem List   Diagnosis Date Noted  . OSA on CPAP 10/18/2016  . Essential hypertension 08/30/2014  . Other and unspecified hyperlipidemia 08/30/2014  . RAD (reactive airway disease) 08/30/2014  . BMI 35.0-35.9,adult 08/30/2014   Past Medical History:  Diagnosis Date  . Anxiety   . Hypertension    Past Surgical History:  Procedure Laterality Date  . HERNIA REPAIR    . SPINE SURGERY     No Known Allergies Prior to Admission medications   Medication Sig Start Date End Date Taking? Authorizing Provider  albuterol (PROAIR HFA) 108 (90 Base) MCG/ACT inhaler Inhale 2 puffs into the lungs every 6 (six) hours as needed for wheezing or shortness of breath. 04/13/17   Magdalene River, PA-C  atorvastatin (LIPITOR) 20 MG tablet Take 1 tablet (20 mg total) by mouth daily. 04/13/17   Magdalene River, PA-C  Fluticasone-Salmeterol (ADVAIR) 100-50 MCG/DOSE AEPB Inhale 1 puff into the lungs 2 (two) times daily. 10/18/16   Shade Flood, MD  hydrochlorothiazide (HYDRODIURIL) 25 MG tablet Take 1 tablet (25 mg total) by mouth daily. 04/13/17   Magdalene River, PA-C  lisinopril (PRINIVIL,ZESTRIL) 10 MG tablet Take 1 tablet (10 mg total) by mouth daily. 04/13/17   Magdalene River, PA-C   Social History   Social History  . Marital status: Married    Spouse name: N/A  . Number of children:  N/A  . Years of education: N/A   Occupational History  . Not on file.   Social History Main Topics  . Smoking status: Former Smoker    Types: Cigarettes  . Smokeless tobacco: Current User    Types: Snuff    Last attempt to quit: 04/28/2012  . Alcohol use No  . Drug use: No  . Sexual activity: Yes    Birth control/ protection: None   Other Topics Concern  . Not on file   Social History Narrative  . No narrative on file   Review of Systems  Constitutional: Negative for fatigue and unexpected weight change.  Eyes: Negative for visual disturbance.  Respiratory: Negative  for cough, chest tightness, shortness of breath and wheezing.   Cardiovascular: Negative for chest pain, palpitations and leg swelling.  Gastrointestinal: Negative for abdominal pain and blood in stool.  Genitourinary: Negative for difficulty urinating.  Neurological: Negative for dizziness, light-headedness and headaches.   Objective:  Physical Exam  Constitutional: He is oriented to person, place, and time. He appears well-developed and well-nourished. No distress.  HENT:  Head: Normocephalic and atraumatic.  Eyes: Conjunctivae and EOM are normal. Pupils are equal, round, and reactive to light.  Neck: Neck supple. No JVD present. Carotid bruit is not present.  Cardiovascular: Normal rate, regular rhythm and normal heart sounds.  Exam reveals no gallop and no friction rub.   No murmur heard. Pulmonary/Chest: Effort normal and breath sounds normal. No respiratory distress. He has no wheezes. He has no rales.  Abdominal:  No bruit  Musculoskeletal: He exhibits no edema.  Neurological: He is alert and oriented to person, place, and time.  Skin: Skin is warm and dry.  Psychiatric: He has a normal mood and affect. His behavior is normal.  Nursing note and vitals reviewed.   Vitals:   04/27/17 1721 04/27/17 1726  BP: (!) 161/85 140/88  Pulse: 81   Resp: 16   Temp: 99 F (37.2 C)   TempSrc: Oral   SpO2: 95%   Weight: 262 lb 12.8 oz (119.2 kg)   Height: 5' 11.5" (1.816 m)    Body mass index is 36.14 kg/m. Assessment & Plan:   ZERICK PREVETTE is a 46 y.o. male Essential hypertension - Plan: Basic metabolic panel  - Still decreased control, now on ACE inhibitor for the past 2 weeks. Likely will need 20 mg dose of lisinopril. Can try increasing to 20 mg if home readings remain over 140/90 this weekend, including with decreased alcohol intake. Remain on 25 mg HCTZ daily. Call with update on dosing as combination pill likely.  -Check BMP to evaluate creatinine, if any bump in  creatinine, would recommend renal arterial evaluation.  -recheck in 3 months.   Mild intermittent asthma, unspecified whether complicated  - Stable, consider inhaled corticosteroid by itself if still daily dosing of Advair. Has albuterol if needed.  Elevated LFTs  -Single elevated LFT, may be related to alcohol use, or weight. Plans on labs through work, if not can recheck at follow-up visit in 3 months. Work on weight loss, avoidance of heavy alcohol use.   No orders of the defined types were placed in this encounter.  Patient Instructions   Avoid excessive alcohol use as that can elevate blood pressure.   If blood pressure remains over 140/90 into next week - increase to 2 lisinopril each day (total dose of 20mg  once per day).  If you do end up switching to that dose, let me  know and I will send in a new prescription that is a combo pill of 20mg  lisinopril and 25mg  of HCTZ.   One liver test was elevated previously.  Watch diet, work on weight and try to decrease alcohol use. Recheck that level in 3 months. You can also bring a copy of your bloodwork from work as that may include those readings.   Recheck with me in 3 months.   Advair up to twice per day for asthma, albuterol inhaler if needed.   Return to the clinic or go to the nearest emergency room if any of your symptoms worsen or new symptoms occur.  Hypertension Hypertension, commonly called high blood pressure, is when the force of blood pumping through the arteries is too strong. The arteries are the blood vessels that carry blood from the heart throughout the body. Hypertension forces the heart to work harder to pump blood and may cause arteries to become narrow or stiff. Having untreated or uncontrolled hypertension can cause heart attacks, strokes, kidney disease, and other problems. A blood pressure reading consists of a higher number over a lower number. Ideally, your blood pressure should be below 120/80. The first ("top")  number is called the systolic pressure. It is a measure of the pressure in your arteries as your heart beats. The second ("bottom") number is called the diastolic pressure. It is a measure of the pressure in your arteries as the heart relaxes. What are the causes? The cause of this condition is not known. What increases the risk? Some risk factors for high blood pressure are under your control. Others are not. Factors you can change   Smoking.  Having type 2 diabetes mellitus, high cholesterol, or both.  Not getting enough exercise or physical activity.  Being overweight.  Having too much fat, sugar, calories, or salt (sodium) in your diet.  Drinking too much alcohol. Factors that are difficult or impossible to change   Having chronic kidney disease.  Having a family history of high blood pressure.  Age. Risk increases with age.  Race. You may be at higher risk if you are African-American.  Gender. Men are at higher risk than women before age 48. After age 48, women are at higher risk than men.  Having obstructive sleep apnea.  Stress. What are the signs or symptoms? Extremely high blood pressure (hypertensive crisis) may cause:  Headache.  Anxiety.  Shortness of breath.  Nosebleed.  Nausea and vomiting.  Severe chest pain.  Jerky movements you cannot control (seizures). How is this diagnosed? This condition is diagnosed by measuring your blood pressure while you are seated, with your arm resting on a surface. The cuff of the blood pressure monitor will be placed directly against the skin of your upper arm at the level of your heart. It should be measured at least twice using the same arm. Certain conditions can cause a difference in blood pressure between your right and left arms. Certain factors can cause blood pressure readings to be lower or higher than normal (elevated) for a short period of time:  When your blood pressure is higher when you are in a health  care provider's office than when you are at home, this is called white coat hypertension. Most people with this condition do not need medicines.  When your blood pressure is higher at home than when you are in a health care provider's office, this is called masked hypertension. Most people with this condition may need medicines to control blood  pressure. If you have a high blood pressure reading during one visit or you have normal blood pressure with other risk factors:  You may be asked to return on a different day to have your blood pressure checked again.  You may be asked to monitor your blood pressure at home for 1 week or longer. If you are diagnosed with hypertension, you may have other blood or imaging tests to help your health care provider understand your overall risk for other conditions. How is this treated? This condition is treated by making healthy lifestyle changes, such as eating healthy foods, exercising more, and reducing your alcohol intake. Your health care provider may prescribe medicine if lifestyle changes are not enough to get your blood pressure under control, and if:  Your systolic blood pressure is above 130.  Your diastolic blood pressure is above 80. Your personal target blood pressure may vary depending on your medical conditions, your age, and other factors. Follow these instructions at home: Eating and drinking   Eat a diet that is high in fiber and potassium, and low in sodium, added sugar, and fat. An example eating plan is called the DASH (Dietary Approaches to Stop Hypertension) diet. To eat this way:  Eat plenty of fresh fruits and vegetables. Try to fill half of your plate at each meal with fruits and vegetables.  Eat whole grains, such as whole wheat pasta, brown rice, or whole grain bread. Fill about one quarter of your plate with whole grains.  Eat or drink low-fat dairy products, such as skim milk or low-fat yogurt.  Avoid fatty cuts of meat,  processed or cured meats, and poultry with skin. Fill about one quarter of your plate with lean proteins, such as fish, chicken without skin, beans, eggs, and tofu.  Avoid premade and processed foods. These tend to be higher in sodium, added sugar, and fat.  Reduce your daily sodium intake. Most people with hypertension should eat less than 1,500 mg of sodium a day.  Limit alcohol intake to no more than 1 drink a day for nonpregnant women and 2 drinks a day for men. One drink equals 12 oz of beer, 5 oz of wine, or 1 oz of hard liquor. Lifestyle   Work with your health care provider to maintain a healthy body weight or to lose weight. Ask what an ideal weight is for you.  Get at least 30 minutes of exercise that causes your heart to beat faster (aerobic exercise) most days of the week. Activities may include walking, swimming, or biking.  Include exercise to strengthen your muscles (resistance exercise), such as pilates or lifting weights, as part of your weekly exercise routine. Try to do these types of exercises for 30 minutes at least 3 days a week.  Do not use any products that contain nicotine or tobacco, such as cigarettes and e-cigarettes. If you need help quitting, ask your health care provider.  Monitor your blood pressure at home as told by your health care provider.  Keep all follow-up visits as told by your health care provider. This is important. Medicines   Take over-the-counter and prescription medicines only as told by your health care provider. Follow directions carefully. Blood pressure medicines must be taken as prescribed.  Do not skip doses of blood pressure medicine. Doing this puts you at risk for problems and can make the medicine less effective.  Ask your health care provider about side effects or reactions to medicines that you should watch for.  Contact a health care provider if:  You think you are having a reaction to a medicine you are taking.  You have  headaches that keep coming back (recurring).  You feel dizzy.  You have swelling in your ankles.  You have trouble with your vision. Get help right away if:  You develop a severe headache or confusion.  You have unusual weakness or numbness.  You feel faint.  You have severe pain in your chest or abdomen.  You vomit repeatedly.  You have trouble breathing. Summary  Hypertension is when the force of blood pumping through your arteries is too strong. If this condition is not controlled, it may put you at risk for serious complications.  Your personal target blood pressure may vary depending on your medical conditions, your age, and other factors. For most people, a normal blood pressure is less than 120/80.  Hypertension is treated with lifestyle changes, medicines, or a combination of both. Lifestyle changes include weight loss, eating a healthy, low-sodium diet, exercising more, and limiting alcohol. This information is not intended to replace advice given to you by your health care provider. Make sure you discuss any questions you have with your health care provider. Document Released: 12/12/2005 Document Revised: 11/09/2016 Document Reviewed: 11/09/2016 Elsevier Interactive Patient Education  2017 [employer].    IF you received an x-ray today, you will receive an invoice from [facility]. Please contact [facility] at [phone] with questions or concerns regarding your invoice.   IF you received labwork today, you will receive an invoice from [place]. Please contact [facility] at [phone] with questions or concerns regarding your invoice.   Our billing staff will not be able to assist you with questions regarding bills from these companies.  You will be contacted with the lab results as soon as they are available. The fastest way to get your results is to activate your My Chart account. Instructions are located on the last page of this paperwork. If  you have not heard from [place] regarding the results in 2 weeks, please contact this office.       I personally performed the services described in this documentation, which was scribed in my presence. The recorded information has been reviewed and considered for accuracy and completeness, addended by me as needed, and agree with information above.  Signed,   [name], MD Primary Care at [facility].  04/27/17 6:17 PM

## 2017-04-28 LAB — BASIC METABOLIC PANEL
BUN / CREAT RATIO: 35 — AB (ref 9–20)
BUN: 25 mg/dL — AB (ref 6–24)
CHLORIDE: 103 mmol/L (ref 96–106)
CO2: 22 mmol/L (ref 18–29)
Calcium: 9.5 mg/dL (ref 8.7–10.2)
Creatinine, Ser: 0.72 mg/dL — ABNORMAL LOW (ref 0.76–1.27)
GFR calc non Af Amer: 111 mL/min/{1.73_m2} (ref >59)
GFR, EST AFRICAN AMERICAN: 128 mL/min/{1.73_m2} (ref >59)
GLUCOSE: 86 mg/dL (ref 65–99)
POTASSIUM: 3.6 mmol/L (ref 3.5–5.2)
SODIUM: 144 mmol/L (ref 134–144)

## 2017-05-03 ENCOUNTER — Encounter: Payer: Self-pay | Admitting: Radiology

## 2017-05-05 ENCOUNTER — Other Ambulatory Visit: Payer: Self-pay | Admitting: Physician Assistant

## 2017-05-05 DIAGNOSIS — I10 Essential (primary) hypertension: Secondary | ICD-10-CM

## 2017-05-06 ENCOUNTER — Other Ambulatory Visit: Payer: Self-pay | Admitting: Family Medicine

## 2017-05-06 DIAGNOSIS — I10 Essential (primary) hypertension: Secondary | ICD-10-CM

## 2017-05-06 NOTE — Telephone Encounter (Signed)
Pt called to request a refill of his lisinopril and hydrochlorothiazide.  Pt states that Neva SeatGreene had told him to double up on his lisinopril and he is currently out.  He is also about to run out of his hydrochlorothiazide which Neva SeatGreene had also changed.  Pt still uses the [facility] in [place].    [phone]

## 2017-05-08 NOTE — Telephone Encounter (Signed)
See doses and ok, based on last ov note

## 2017-05-09 MED ORDER — LISINOPRIL-HYDROCHLOROTHIAZIDE 20-25 MG PO TABS: 1.0000 | ORAL_TABLET | Freq: Every day | ORAL | 1 refills | Status: DC

## 2017-05-09 NOTE — Telephone Encounter (Signed)
I sent in combo of lisinopril at higher dose of 20mg  and 25mg  dose of HCTZ. Make sure he is aware of this change and that these are the doses of those individual components he has been taking.

## 2017-05-15 NOTE — Telephone Encounter (Signed)
Pt advised.

## 2017-07-29 ENCOUNTER — Other Ambulatory Visit: Payer: Self-pay | Admitting: Family Medicine

## 2017-07-29 DIAGNOSIS — J4530 Mild persistent asthma, uncomplicated: Secondary | ICD-10-CM

## 2017-07-29 NOTE — Telephone Encounter (Signed)
Pt is calling to check on the status of his Advair inhaler refill.  Pt states that his pharmacy sent in the request today and is checking on it.  I did advise that we do not typically do refills during weekend hours.  Pt was not happy with this and was hostile towards me.  Pt still uses the [facility] on [address]  Please advise

## 2017-08-03 ENCOUNTER — Ambulatory Visit (INDEPENDENT_AMBULATORY_CARE_PROVIDER_SITE_OTHER): Payer: Managed Care, Other (non HMO) | Admitting: Family Medicine

## 2017-08-03 ENCOUNTER — Encounter: Payer: Self-pay | Admitting: Family Medicine

## 2017-08-03 VITALS — BP 124/80 | HR 82 | Temp 99.1°F | Resp 16 | Ht 71.5 in | Wt 242.0 lb

## 2017-08-03 DIAGNOSIS — J4530 Mild persistent asthma, uncomplicated: Secondary | ICD-10-CM | POA: Diagnosis not present

## 2017-08-03 DIAGNOSIS — I10 Essential (primary) hypertension: Secondary | ICD-10-CM | POA: Diagnosis not present

## 2017-08-03 DIAGNOSIS — E785 Hyperlipidemia, unspecified: Secondary | ICD-10-CM | POA: Diagnosis not present

## 2017-08-03 MED ORDER — ALBUTEROL SULFATE HFA 108 (90 BASE) MCG/ACT IN AERS: 2.0000 | INHALATION_SPRAY | Freq: Four times a day (QID) | RESPIRATORY_TRACT | 1 refills | Status: DC | PRN

## 2017-08-03 MED ORDER — LISINOPRIL-HYDROCHLOROTHIAZIDE 20-25 MG PO TABS: 1.0000 | ORAL_TABLET | Freq: Every day | ORAL | 1 refills | Status: DC

## 2017-08-03 MED ORDER — FLUTICASONE-SALMETEROL 100-50 MCG/DOSE IN AEPB: 1.0000 | INHALATION_SPRAY | Freq: Two times a day (BID) | RESPIRATORY_TRACT | 5 refills | Status: DC

## 2017-08-03 NOTE — Patient Instructions (Addendum)
Blood pressure looks good here today. Good work on the weight loss. I expect the blood pressure and cholesterol to continue to improve.   No change in blood pressure meds or cholesterol meds at this point. Plan on recheck in 6 months.   Continue Advair same dose for now. Albuterol if needed for breakthrough wheezing, but if your symptoms remain controlled, especially on only once daily treatment, could consider change to just flovent inhaler as maintenance medication. Can recheck that in 6 months.     How to Take Your Blood Pressure Blood pressure is a measurement of how strongly your blood is pressing against the walls of your arteries. Arteries are blood vessels that carry blood from your heart throughout your body. Your health care provider takes your blood pressure at each office visit. You can also take your own blood pressure at home with a blood pressure machine. You may need to take your own blood pressure:  To confirm a diagnosis of high blood pressure (hypertension).  To monitor your blood pressure over time.  To make sure your blood pressure medicine is working.  Supplies needed: To take your blood pressure, you will need a blood pressure machine. You can buy a blood pressure machine, or blood pressure monitor, at most drugstores or online. There are several types of home blood pressure monitors. When choosing one, consider the following:  Choose a monitor that has an arm cuff.  Choose a monitor that wraps snugly around your upper arm. You should be able to fit only one finger between your arm and the cuff.  Do not choose a monitor that measures your blood pressure from your wrist or finger.  Your health care provider can suggest a reliable monitor that will meet your needs. How to prepare To get the most accurate reading, avoid the following for 30 minutes before you check your blood pressure:  Drinking caffeine.  Drinking  alcohol.  Eating.  Smoking.  Exercising.  Five minutes before you check your blood pressure:  Empty your bladder.  Sit quietly without talking in a dining chair, rather than in a soft couch or armchair.  How to take your blood pressure To check your blood pressure, follow the instructions in the manual that came with your blood pressure monitor. If you have a digital blood pressure monitor, the instructions may be as follows: 1. Sit up straight. 2. Place your feet on the floor. Do not cross your ankles or legs. 3. Rest your left arm at the level of your heart on a table or desk or on the arm of a chair. 4. Pull up your shirt sleeve. 5. Wrap the blood pressure cuff around the upper part of your left arm, 1 inch (2.5 cm) above your elbow. It is best to wrap the cuff around bare skin. 6. Fit the cuff snugly around your arm. You should be able to place only one finger between the cuff and your arm. 7. Position the cord inside the groove of your elbow. 8. Press the power button. 9. Sit quietly while the cuff inflates and deflates. 10. Read the digital reading on the monitor screen and write it down (record it). 11. Wait 2-3 minutes, then repeat the steps, starting at step 1.  What does my blood pressure reading mean? A blood pressure reading consists of a higher number over a lower number. Ideally, your blood pressure should be below 120/80. The first ("top") number is called the systolic pressure. It is a measure of  the pressure in your arteries as your heart beats. The second ("bottom") number is called the diastolic pressure. It is a measure of the pressure in your arteries as the heart relaxes. Blood pressure is classified into four stages. The following are the stages for adults who do not have a short-term serious illness or a chronic condition. Systolic pressure and diastolic pressure are measured in a unit called mm Hg. Normal  Systolic pressure: below 120.  Diastolic pressure:  below 80. Elevated  Systolic pressure: 120-129.  Diastolic pressure: below 80. Hypertension stage 1  Systolic pressure: 130-139.  Diastolic pressure: 80-89. Hypertension stage 2  Systolic pressure: 140 or above.  Diastolic pressure: 90 or above. You can have prehypertension or hypertension even if only the systolic or only the diastolic number in your reading is higher than normal. Follow these instructions at home:  Check your blood pressure as often as recommended by your health care provider.  Take your monitor to the next appointment with your health care provider to make sure: ? That you are using it correctly. ? That it provides accurate readings.  Be sure you understand what your goal blood pressure numbers are.  Tell your health care provider if you are having any side effects from blood pressure medicine. Contact a health care provider if:  Your blood pressure is consistently high. Get help right away if:  Your systolic blood pressure is higher than 180.  Your diastolic blood pressure is higher than 110. This information is not intended to replace advice given to you by your health care provider. Make sure you discuss any questions you have with your health care provider. Document Released: 05/20/2016 Document Revised: 08/02/2016 Document Reviewed: 05/20/2016 Elsevier Interactive Patient Education  2018 [employer].     IF you received an x-ray today, you will receive an invoice from [facility]. Please contact [facility] at [phone] with questions or concerns regarding your invoice.   IF you received labwork today, you will receive an invoice from [place]. Please contact [facility] at [phone] with questions or concerns regarding your invoice.   Our billing staff will not be able to assist you with questions regarding bills from these companies.  You will be contacted with the lab results as soon as they are available. The fastest  way to get your results is to activate your My Chart account. Instructions are located on the last page of this paperwork. If you have not heard from [place] regarding the results in 2 weeks, please contact this office.

## 2017-08-03 NOTE — Progress Notes (Signed)
By signing my name below, I, [name], attest that this documentation has been prepared under the direction and in the presence of [name], MD.  Electronically Signed: [name], Medical Scribe. 08/03/17. 6:00 PM.  Subjective:    Patient ID: Billy Gomez, male    DOB: 05-30-1971, 46 y.o.   MRN: [id number]  HPI Chief Complaint  Patient presents with  . Follow-up    HTN    HPI Comments: Billy Gomez is a 46 y.o. male who presents to Primary Care at [facility] for HTN follow-up. Last appt with me May 3rd. Pt is fasting.  HTN: Bp was 138/82 at physical June 5th. Initially we increased lisinopril to 20 mg QD, and eventually switched to the lisinopril-HCTZ combination pill. Pt has not been regularly checking his bp, but last Thursday (7 days ago) and Friday (6 days ago) 140/79-83 after driving for a hour in traffic. Denies experiencing chest pain, SOB, or other negative side effects. Lab Results  Component Value Date   CREATININE 0.72 (L) 04/27/2017   BP Readings from Last 3 Encounters:  08/03/17 124/80  04/27/17 140/88  04/13/17 (!) 148/92   Sleep Loss: Pt gets home around 8pm and he isn't ready to go to sleep so he stays up late and wakes up early.   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 He's also been eating more salads and vegetables. Wt Readings from Last 3 Encounters:  08/03/17 242 lb (109.8 kg)  04/27/17 262 lb 12.8 oz (119.2 kg)  04/13/17 266 lb (120.7 kg)   HLD: Tolerating lipitor 20 mg QD. Pt is complaint with lipitor. Denies myalgias or other new side effects. Lab Results  Component Value Date   CHOL 168 02/18/2017   HDL 58 02/18/2017   LDLCALC 90 02/18/2017   TRIG 101 02/18/2017   CHOLHDL 2.9 02/18/2017   Lab Results  Component Value Date   ALT 49 (H) 04/13/2017   AST 31 04/13/2017   ALKPHOS 76 04/13/2017   BILITOT 1.1 04/13/2017   Back/Hip Pain: Reports back/hip pain.  Asthma:  Uses adviar. Pt takes it advair QD and albuterol a couple of times today and yesterday since he didn't have advair but he typically takes it 1x a week.  Patient Active Problem List   Diagnosis Date Noted  . OSA on CPAP 10/18/2016  . Essential hypertension 08/30/2014  . Other and unspecified hyperlipidemia 08/30/2014  . RAD (reactive airway disease) 08/30/2014  . BMI 35.0-35.9,adult 08/30/2014   Past Medical History:  Diagnosis Date  . Anxiety   . Hypertension    Past Surgical History:  Procedure Laterality Date  . HERNIA REPAIR    . SPINE SURGERY     No Known Allergies Prior to Admission medications   Medication Sig Start Date End Date Taking? Authorizing Provider  ADVAIR DISKUS 100-50 MCG/DOSE AEPB INHALE ONE DOSE BY MOUTH TWICE DAILY 07/31/17  Yes Shade Flood, MD  albuterol (PROAIR HFA) 108 (90 Base) MCG/ACT inhaler Inhale 2 puffs into the lungs every 6 (six) hours as needed for wheezing or shortness of breath. 04/13/17  Yes Barnett Abu, [place] D, PA-C  atorvastatin (LIPITOR) 20 MG tablet Take 1 tablet (20 mg total) by mouth daily. 04/13/17  Yes Barnett Abu, [place] D, PA-C  lisinopril-hydrochlorothiazide (PRINZIDE,ZESTORETIC) 20-25 MG tablet Take 1 tablet by mouth daily. 05/09/17  Yes Shade Flood, MD  hydrochlorothiazide (HYDRODIURIL) 25 MG tablet Take 1 tablet (25 mg total) by  mouth daily. Patient not taking: Reported on 08/03/2017 04/13/17   Benjiman CoreWiseman, Brittany D, PA-C  lisinopril (PRINIVIL,ZESTRIL) 10 MG tablet Take 1 tablet (10 mg total) by mouth daily. Patient not taking: Reported on 08/03/2017 04/13/17   Magdalene RiverWiseman, Brittany D, PA-C   Social History   Social History  . Marital status: Married    Spouse name: N/A  . Number of children: N/A  . Years of education: N/A   Occupational History  . Not on file.   Social History Main Topics  . Smoking status: Former Smoker    Types: Cigarettes  . Smokeless tobacco: Current User    Types: Snuff    Last attempt to quit: 04/28/2012   . Alcohol use No  . Drug use: No  . Sexual activity: Yes    Birth control/ protection: None   Other Topics Concern  . Not on file   Social History Narrative  . No narrative on file   Review of Systems  Constitutional: Negative for fatigue and unexpected weight change.  Eyes: Negative for visual disturbance.  Respiratory: Negative for cough, chest tightness and shortness of breath.   Cardiovascular: Negative for chest pain, palpitations and leg swelling.  Gastrointestinal: Negative for abdominal pain and blood in stool.  Musculoskeletal: Negative for myalgias.  Neurological: Negative for dizziness, light-headedness and headaches.   Objective:  Physical Exam  Constitutional: He is oriented to person, place, and time. He appears well-developed and well-nourished.  HENT:  Head: Normocephalic and atraumatic.  Eyes: Pupils are equal, round, and reactive to light. EOM are normal.  Neck: No JVD present. Carotid bruit is not present.  Cardiovascular: Normal rate, regular rhythm and normal heart sounds.  Exam reveals no gallop and no friction rub.   No murmur heard. Pulmonary/Chest: Effort normal. No respiratory distress. He has no wheezes. He has no rales.  Musculoskeletal: He exhibits no edema.  Neurological: He is alert and oriented to person, place, and time.  Skin: Skin is warm and dry.  Psychiatric: He has a normal mood and affect.  Vitals reviewed.   Vitals:   08/03/17 1705  BP: 124/80  Pulse: 82  Resp: 16  Temp: 99.1 F (37.3 C)  TempSrc: Oral  SpO2: 94%  Weight: 242 lb (109.8 kg)  Height: 5' 11.5" (1.816 m)  Body mass index is 33.28 kg/m. Assessment & Plan:    Billy Grosugene E Mcnutt is a 46 y.o. male Essential hypertension - Plan: Comprehensive metabolic panel, lisinopril-hydrochlorothiazide (PRINZIDE,ZESTORETIC) 20-25 MG tablet  - controlled on above regimen. Continue same dose, commended on weight loss.   -check labs, recheck in 6 months, monitor home  readings  Hyperlipidemia, unspecified hyperlipidemia type - Plan: Lipid panel  - tolerating statin, check lipids.   RAD (reactive airway disease), mild persistent, uncomplicated - Plan: Fluticasone-Salmeterol (ADVAIR DISKUS) 100-50 MCG/DOSE AEPB, albuterol (PROAIR HFA) 108 (90 Base) MCG/ACT inhaler  - well controlled, consider change to flovent as daily regimen if remains stable. Refilled advair for now, albuterol if needed. rtc precautions.   Meds ordered this encounter  Medications  . Fluticasone-Salmeterol (ADVAIR DISKUS) 100-50 MCG/DOSE AEPB    Sig: Inhale 1 puff into the lungs 2 (two) times daily.    Dispense:  60 each    Refill:  5    Please consider 90 day supplies to promote better adherence  . albuterol (PROAIR HFA) 108 (90 Base) MCG/ACT inhaler    Sig: Inhale 2 puffs into the lungs every 6 (six) hours as needed for wheezing or shortness  of breath.    Dispense:  1 Inhaler    Refill:  1  . lisinopril-hydrochlorothiazide (PRINZIDE,ZESTORETIC) 20-25 MG tablet    Sig: Take 1 tablet by mouth daily.    Dispense:  90 tablet    Refill:  1   Patient Instructions   Blood pressure looks good here today. Good work on the weight loss. I expect the blood pressure and cholesterol to continue to improve.   No change in blood pressure meds or cholesterol meds at this point. Plan on recheck in 6 months.   Continue Advair same dose for now. Albuterol if needed for breakthrough wheezing, but if your symptoms remain controlled, especially on only once daily treatment, could consider change to just flovent inhaler as maintenance medication. Can recheck that in 6 months.     How to Take Your Blood Pressure Blood pressure is a measurement of how strongly your blood is pressing against the walls of your arteries. Arteries are blood vessels that carry blood from your heart throughout your body. Your health care provider takes your blood pressure at each office visit. You can also take your own blood  pressure at home with a blood pressure machine. You may need to take your own blood pressure:  To confirm a diagnosis of high blood pressure (hypertension).  To monitor your blood pressure over time.  To make sure your blood pressure medicine is working.  Supplies needed: To take your blood pressure, you will need a blood pressure machine. You can buy a blood pressure machine, or blood pressure monitor, at most drugstores or online. There are several types of home blood pressure monitors. When choosing one, consider the following:  Choose a monitor that has an arm cuff.  Choose a monitor that wraps snugly around your upper arm. You should be able to fit only one finger between your arm and the cuff.  Do not choose a monitor that measures your blood pressure from your wrist or finger.  Your health care provider can suggest a reliable monitor that will meet your needs. How to prepare To get the most accurate reading, avoid the following for 30 minutes before you check your blood pressure:  Drinking caffeine.  Drinking alcohol.  Eating.  Smoking.  Exercising.  Five minutes before you check your blood pressure:  Empty your bladder.  Sit quietly without talking in a dining chair, rather than in a soft couch or armchair.  How to take your blood pressure To check your blood pressure, follow the instructions in the manual that came with your blood pressure monitor. If you have a digital blood pressure monitor, the instructions may be as follows: 1. Sit up straight. 2. Place your feet on the floor. Do not cross your ankles or legs. 3. Rest your left arm at the level of your heart on a table or desk or on the arm of a chair. 4. Pull up your shirt sleeve. 5. Wrap the blood pressure cuff around the upper part of your left arm, 1 inch (2.5 cm) above your elbow. It is best to wrap the cuff around bare skin. 6. Fit the cuff snugly around your arm. You should be able to place only one  finger between the cuff and your arm. 7. Position the cord inside the groove of your elbow. 8. Press the power button. 9. Sit quietly while the cuff inflates and deflates. 10. Read the digital reading on the monitor screen and write it down (record it). 11. Wait 2-3  minutes, then repeat the steps, starting at step 1.  What does my blood pressure reading mean? A blood pressure reading consists of a higher number over a lower number. Ideally, your blood pressure should be below 120/80. The first ("top") number is called the systolic pressure. It is a measure of the pressure in your arteries as your heart beats. The second ("bottom") number is called the diastolic pressure. It is a measure of the pressure in your arteries as the heart relaxes. Blood pressure is classified into four stages. The following are the stages for adults who do not have a short-term serious illness or a chronic condition. Systolic pressure and diastolic pressure are measured in a unit called mm Hg. Normal  Systolic pressure: below 120.  Diastolic pressure: below 80. Elevated  Systolic pressure: 120-129.  Diastolic pressure: below 80. Hypertension stage 1  Systolic pressure: 130-139.  Diastolic pressure: 80-89. Hypertension stage 2  Systolic pressure: 140 or above.  Diastolic pressure: 90 or above. You can have prehypertension or hypertension even if only the systolic or only the diastolic number in your reading is higher than normal. Follow these instructions at home:  Check your blood pressure as often as recommended by your health care provider.  Take your monitor to the next appointment with your health care provider to make sure: ? That you are using it correctly. ? That it provides accurate readings.  Be sure you understand what your goal blood pressure numbers are.  Tell your health care provider if you are having any side effects from blood pressure medicine. Contact a health care provider  if:  Your blood pressure is consistently high. Get help right away if:  Your systolic blood pressure is higher than 180.  Your diastolic blood pressure is higher than 110. This information is not intended to replace advice given to you by your health care provider. Make sure you discuss any questions you have with your health care provider. Document Released: 05/20/2016 Document Revised: 08/02/2016 Document Reviewed: 05/20/2016 Elsevier Interactive Patient Education  2018 [employer].     IF you received an x-ray today, you will receive an invoice from [facility]. Please contact [facility] at [phone] with questions or concerns regarding your invoice.   IF you received labwork today, you will receive an invoice from [facility]. Please contact [facility] at [phone] with questions or concerns regarding your invoice.   Our billing staff will not be able to assist you with questions regarding bills from these companies.  You will be contacted with the lab results as soon as they are available. The fastest way to get your results is to activate your My Chart account. Instructions are located on the last page of this paperwork. If you have not heard from [place] regarding the results in 2 weeks, please contact this office.      I personally performed the services described in this documentation, which was scribed in my presence. The recorded information has been reviewed and considered for accuracy and completeness, addended by me as needed, and agree with information above.  Signed,   [name], MD Primary Care at [facility].  08/03/17 6:22 PM

## 2017-08-04 LAB — LIPID PANEL
Chol/HDL Ratio: 3.8 ratio (ref 0.0–5.0)
Cholesterol, Total: 197 mg/dL (ref 100–199)
HDL: 52 mg/dL (ref >39)
LDL Calculated: 121 mg/dL — ABNORMAL HIGH (ref 0–99)
Triglycerides: 119 mg/dL (ref 0–149)
VLDL CHOLESTEROL CAL: 24 mg/dL (ref 5–40)

## 2017-08-04 LAB — COMPREHENSIVE METABOLIC PANEL
ALT: 41 IU/L (ref 0–44)
AST: 25 IU/L (ref 0–40)
Albumin/Globulin Ratio: 2.2 (ref 1.2–2.2)
Albumin: 4.8 g/dL (ref 3.5–5.5)
Alkaline Phosphatase: 69 IU/L (ref 39–117)
BUN/Creatinine Ratio: 33 — ABNORMAL HIGH (ref 9–20)
BUN: 29 mg/dL — AB (ref 6–24)
Bilirubin Total: 0.8 mg/dL (ref 0.0–1.2)
CALCIUM: 9.7 mg/dL (ref 8.7–10.2)
CHLORIDE: 107 mmol/L — AB (ref 96–106)
CO2: 22 mmol/L (ref 20–29)
Creatinine, Ser: 0.87 mg/dL (ref 0.76–1.27)
GFR calc non Af Amer: 102 mL/min/{1.73_m2} (ref >59)
GFR, EST AFRICAN AMERICAN: 118 mL/min/{1.73_m2} (ref >59)
GLUCOSE: 82 mg/dL (ref 65–99)
Globulin, Total: 2.2 g/dL (ref 1.5–4.5)
Potassium: 3.8 mmol/L (ref 3.5–5.2)
Sodium: 147 mmol/L — ABNORMAL HIGH (ref 134–144)
TOTAL PROTEIN: 7 g/dL (ref 6.0–8.5)

## 2017-08-18 ENCOUNTER — Encounter: Payer: Self-pay | Admitting: Physician Assistant

## 2017-08-18 ENCOUNTER — Ambulatory Visit (INDEPENDENT_AMBULATORY_CARE_PROVIDER_SITE_OTHER): Payer: Managed Care, Other (non HMO) | Admitting: Physician Assistant

## 2017-08-18 VITALS — BP 119/71 | HR 72 | Temp 98.1°F | Resp 16 | Ht 71.5 in | Wt 243.2 lb

## 2017-08-18 DIAGNOSIS — L723 Sebaceous cyst: Secondary | ICD-10-CM | POA: Diagnosis not present

## 2017-08-18 NOTE — Progress Notes (Signed)
   KASEN RAMMER  MRN: [id number] DOB: [birth year]  Subjective:  Billy Gomez is a 46 y.o. male seen in office today for a chief complaint of cyst on left side of neck x 1 year. Notes he had it removed once in the same spot in 2015 but then it returned. It is just irritating him now. Denies pain, redness, warmth, and purulent drainage. Has no other questions or concerns.   Review of Systems  Constitutional: Negative for chills, diaphoresis, fatigue and fever.  Gastrointestinal: Negative for nausea and vomiting.    Patient Active Problem List   Diagnosis Date Noted  . OSA on CPAP 10/18/2016  . Essential hypertension 08/30/2014  . Other and unspecified hyperlipidemia 08/30/2014  . RAD (reactive airway disease) 08/30/2014  . BMI 35.0-35.9,adult 08/30/2014    Current Outpatient Prescriptions on File Prior to Visit  Medication Sig Dispense Refill  . albuterol (PROAIR HFA) 108 (90 Base) MCG/ACT inhaler Inhale 2 puffs into the lungs every 6 (six) hours as needed for wheezing or shortness of breath. 1 Inhaler 1  . atorvastatin (LIPITOR) 20 MG tablet Take 1 tablet (20 mg total) by mouth daily. 90 tablet 3  . Fluticasone-Salmeterol (ADVAIR DISKUS) 100-50 MCG/DOSE AEPB Inhale 1 puff into the lungs 2 (two) times daily. 60 each 5  . lisinopril-hydrochlorothiazide (PRINZIDE,ZESTORETIC) 20-25 MG tablet Take 1 tablet by mouth daily. 90 tablet 1   No current facility-administered medications on file prior to visit.     No Known Allergies   Objective:  BP 119/71 (BP Location: Right Arm, Patient Position: Sitting, Cuff Size: Large)   Pulse 72   Temp 98.1 F (36.7 C) (Oral)   Resp 16   Ht 5' 11.5" (1.816 m)   Wt 243 lb 3.2 oz (110.3 kg)   SpO2 96%   BMI 33.45 kg/m   Physical Exam  Constitutional: He is oriented to person, place, and time and well-developed, well-nourished, and in no distress.  HENT:  Head: Normocephalic and atraumatic.  Eyes: Conjunctivae are normal.  Neck:  Normal range of motion.  Pulmonary/Chest: Effort normal.  Neurological: He is alert and oriented to person, place, and time. Gait normal.  Skin: Skin is warm and dry.  1cm freely movable firm subcutnaeous nodule on left posterior cervical area with central punctum and well healed scar noted.   Psychiatric: Affect normal.  Vitals reviewed.  PROCEDURE NOTE: sebaceous cyst excision Verbal consent obtained. Local anesthesia with 4cc of 2% lidocaine with epinephrine. Sterile prep and drape. An elliptical incision was made extending ~0.75 cm using a 15 blade, the cyst was dissected from the surrounding tissue with curved hemostats and removed almost entirely intact. Wound cavity cleansed with 3cc NS fluid. No remnants of sac noted.  Wound closed with #1 5-0 Ethilon horizontal mattress sutures. Cleansed and dressed.  Assessment and Plan :  1. Sebaceous cyst Excision preformed. After care instructions given. Pt instructed to return in 7-10 days for suture removal.   [name] PA-C  Primary Care at [facility] 08/18/2017 8:39 AM

## 2017-08-18 NOTE — Patient Instructions (Addendum)
WOUND CARE Please return in 7-10 days to have your stitches/staples removed or sooner if you have concerns. . Keep area clean and dry for 24 hours. Do not remove bandage, if applied. . After 24 hours, remove bandage and wash wound gently with mild soap and warm water. Reapply a new bandage after cleaning wound, if directed. . Continue daily cleansing with soap and water until stitches/staples are removed. . Do not apply any ointments or creams to the wound while stitches/staples are in place, as this may cause delayed healing. . Notify the office if you experience any of the following signs of infection: Swelling, redness, pus drainage, streaking, fever >101.0 F . Notify the office if you experience excessive bleeding that does not stop after 15-20 minutes of constant, firm pressure.     IF you received an x-ray today, you will receive an invoice from [facility]. Please contact [facility] at [phone] with questions or concerns regarding your invoice.   IF you received labwork today, you will receive an invoice from [facility]. Please contact [facility] at [phone] with questions or concerns regarding your invoice.   Our billing staff will not be able to assist you with questions regarding bills from these companies.  You will be contacted with the lab results as soon as they are available. The fastest way to get your results is to activate your My Chart account. Instructions are located on the last page of this paperwork. If you have not heard from us regarding the results in 2 weeks, please contact this office.      

## 2017-08-19 ENCOUNTER — Other Ambulatory Visit: Payer: Self-pay | Admitting: Family Medicine

## 2017-08-19 DIAGNOSIS — E870 Hyperosmolality and hypernatremia: Secondary | ICD-10-CM

## 2017-08-31 ENCOUNTER — Ambulatory Visit (INDEPENDENT_AMBULATORY_CARE_PROVIDER_SITE_OTHER): Payer: Managed Care, Other (non HMO) | Admitting: Family Medicine

## 2017-08-31 DIAGNOSIS — E870 Hyperosmolality and hypernatremia: Secondary | ICD-10-CM

## 2017-09-01 LAB — BASIC METABOLIC PANEL
BUN/Creatinine Ratio: 34 — ABNORMAL HIGH (ref 9–20)
BUN: 20 mg/dL (ref 6–24)
CALCIUM: 9.4 mg/dL (ref 8.7–10.2)
CO2: 22 mmol/L (ref 20–29)
CREATININE: 0.59 mg/dL — AB (ref 0.76–1.27)
Chloride: 107 mmol/L — ABNORMAL HIGH (ref 96–106)
GFR, EST AFRICAN AMERICAN: 138 mL/min/{1.73_m2} (ref >59)
GFR, EST NON AFRICAN AMERICAN: 120 mL/min/{1.73_m2} (ref >59)
Glucose: 84 mg/dL (ref 65–99)
Potassium: 4.4 mmol/L (ref 3.5–5.2)
Sodium: 145 mmol/L — ABNORMAL HIGH (ref 134–144)

## 2017-09-02 NOTE — Progress Notes (Signed)
Not seen by provider today - lab draw only

## 2017-09-11 ENCOUNTER — Encounter: Payer: Self-pay | Admitting: *Deleted

## 2018-04-18 ENCOUNTER — Telehealth: Payer: Self-pay | Admitting: Physician Assistant

## 2018-05-17 ENCOUNTER — Encounter: Payer: Self-pay | Admitting: Family Medicine

## 2018-09-10 ENCOUNTER — Ambulatory Visit (INDEPENDENT_AMBULATORY_CARE_PROVIDER_SITE_OTHER): Payer: BLUE CROSS/BLUE SHIELD

## 2018-09-10 ENCOUNTER — Ambulatory Visit: Payer: BLUE CROSS/BLUE SHIELD | Admitting: Emergency Medicine

## 2018-09-10 ENCOUNTER — Other Ambulatory Visit: Payer: Self-pay

## 2018-09-10 ENCOUNTER — Encounter: Payer: Self-pay | Admitting: Emergency Medicine

## 2018-09-10 VITALS — BP 160/80 | HR 78 | Temp 99.3°F | Resp 16 | Ht 71.25 in | Wt 245.0 lb

## 2018-09-10 DIAGNOSIS — M25552 Pain in left hip: Secondary | ICD-10-CM

## 2018-09-10 DIAGNOSIS — J4530 Mild persistent asthma, uncomplicated: Secondary | ICD-10-CM

## 2018-09-10 DIAGNOSIS — I10 Essential (primary) hypertension: Secondary | ICD-10-CM

## 2018-09-10 DIAGNOSIS — M1612 Unilateral primary osteoarthritis, left hip: Secondary | ICD-10-CM

## 2018-09-10 DIAGNOSIS — E782 Mixed hyperlipidemia: Secondary | ICD-10-CM

## 2018-09-10 MED ORDER — ATORVASTATIN CALCIUM 20 MG PO TABS: 20.0000 mg | ORAL_TABLET | Freq: Every day | ORAL | 3 refills | Status: AC

## 2018-09-10 MED ORDER — LISINOPRIL-HYDROCHLOROTHIAZIDE 20-25 MG PO TABS: 1.0000 | ORAL_TABLET | Freq: Every day | ORAL | 3 refills | Status: AC

## 2018-09-10 MED ORDER — FLUTICASONE-SALMETEROL 100-50 MCG/DOSE IN AEPB: 1.0000 | INHALATION_SPRAY | Freq: Two times a day (BID) | RESPIRATORY_TRACT | 5 refills | Status: AC

## 2018-09-10 MED ORDER — ALBUTEROL SULFATE HFA 108 (90 BASE) MCG/ACT IN AERS: 2.0000 | INHALATION_SPRAY | Freq: Four times a day (QID) | RESPIRATORY_TRACT | 1 refills | Status: DC | PRN

## 2018-09-10 NOTE — Patient Instructions (Addendum)
     If you have lab work done today you will be contacted with your lab results within the next 2 weeks.  If you have not heard from us then please contact us. The fastest way to get your results is to register for My Chart.   IF you received an x-ray today, you will receive an invoice from [facility]. Please contact [facility] at [phone] with questions or concerns regarding your invoice.   IF you received labwork today, you will receive an invoice from [facility]. Please contact [facility] at [phone] with questions or concerns regarding your invoice.   Our billing staff will not be able to assist you with questions regarding bills from these companies.  You will be contacted with the lab results as soon as they are available. The fastest way to get your results is to activate your My Chart account. Instructions are located on the last page of this paperwork. If you have not heard from us regarding the results in 2 weeks, please contact this office.      Hip Pain The hip is the joint between the upper legs and the lower pelvis. The bones, cartilage, tendons, and muscles of your hip joint support your body and allow you to move around. Hip pain can range from a minor ache to severe pain in one or both of your hips. The pain may be felt on the inside of the hip joint near the groin, or the outside near the buttocks and upper thigh. You may also have swelling or stiffness. Follow these instructions at home: Managing pain, stiffness, and swelling  If directed, apply ice to the injured area. ? Put ice in a plastic bag. ? Place a towel between your skin and the bag. ? Leave the ice on for 20 minutes, 2-3 times a day  Sleep with a pillow between your legs on your most comfortable side.  Avoid any activities that cause pain. General instructions  Take over-the-counter and prescription medicines only as told by your health care provider.  Do any exercises as told  by your health care provider.  Record the following: ? How often you have hip pain. ? The location of your pain. ? What the pain feels like. ? What makes the pain worse.  Keep all follow-up visits as told by your health care provider. This is important. Contact a health care provider if:  You cannot put weight on your leg.  Your pain or swelling continues or gets worse after one week.  It gets harder to walk.  You have a fever. Get help right away if:  You fall.  You have a sudden increase in pain and swelling in your hip.  Your hip is red or swollen or very tender to touch. Summary  Hip pain can range from a minor ache to severe pain in one or both of your hips.  The pain may be felt on the inside of the hip joint near the groin, or the outside near the buttocks and upper thigh.  Avoid any activities that cause pain.  Record how often you have hip pain, the location of the pain, what makes it worse and what it feels like. This information is not intended to replace advice given to you by your health care provider. Make sure you discuss any questions you have with your health care provider. Document Released: 06/01/2010 Document Revised: 11/14/2016 Document Reviewed: 11/14/2016 Elsevier Interactive Patient Education  2018 [employer]  

## 2018-09-10 NOTE — Progress Notes (Signed)
Billy GrosEugene E Gomez 47 y.o.   Chief Complaint  Patient presents with  . Hip Pain    both more the LEFT x 1 year  . Medication Refill    albuterol, Atorvastatin and lisinopril-hctz   BP Readings from Last 3 Encounters:  09/10/18 (!) 152/82  08/18/17 119/71  08/03/17 124/80   Wt Readings from Last 3 Encounters:  09/10/18 245 lb (111.1 kg)  08/18/17 243 lb 3.2 oz (110.3 kg)  08/03/17 242 lb (109.8 kg)    HISTORY OF PRESENT ILLNESS: This is a 47 y.o. male complaining of pain to his left hip for the past year getting worse the last several months.  No injuries in the past.  Has seen a chiropractor about 5 months ago.  He did some x-rays.  Told that he has arthritis.  Started on physical therapy exercises.  Has been taken NSAIDs.  Pain still persists.  Patient is a Naval architecttruck driver.  No other significant symptoms. Onset: Months ago Location: Left hip Duration: Waxes and wanes Character: Sharp Aggravating factors: Activity Relieving factors: Rest Therapies tried: NSAIDs Severity: Moderate  Also has a history of hypertension.  Needs medication refills.  Also has a history of asthma needs medication refills.  HPI   Prior to Admission medications   Medication Sig Start Date End Date Taking? Authorizing Provider  albuterol (PROAIR HFA) 108 (90 Base) MCG/ACT inhaler Inhale 2 puffs into the lungs every 6 (six) hours as needed for wheezing or shortness of breath. 08/03/17  Yes Shade FloodGreene, Jeffrey R, MD  atorvastatin (LIPITOR) 20 MG tablet Take 1 tablet (20 mg total) by mouth daily. 04/13/17  Yes Barnett AbuWiseman, [place]ittany D, PA-C  Fluticasone-Salmeterol (ADVAIR DISKUS) 100-50 MCG/DOSE AEPB Inhale 1 puff into the lungs 2 (two) times daily. 08/03/17  Yes Shade FloodGreene, Jeffrey R, MD  lisinopril-hydrochlorothiazide (PRINZIDE,ZESTORETIC) 20-25 MG tablet Take 1 tablet by mouth daily. 08/03/17  Yes Shade FloodGreene, Jeffrey R, MD    No Known Allergies  Patient Active Problem List   Diagnosis Date Noted  . OSA on CPAP 10/18/2016    . Essential hypertension 08/30/2014  . Other and unspecified hyperlipidemia 08/30/2014  . RAD (reactive airway disease) 08/30/2014  . BMI 35.0-35.9,adult 08/30/2014    Past Medical History:  Diagnosis Date  . Anxiety   . Hypertension     Past Surgical History:  Procedure Laterality Date  . HERNIA REPAIR    . SPINE SURGERY      Social History   Socioeconomic History  . Marital status: Married    Spouse name: Not on file  . Number of children: Not on file  . Years of education: Not on file  . Highest education level: Not on file  Occupational History  . Not on file  Social Needs  . Financial resource strain: Not on file  . Food insecurity:    Worry: Not on file    Inability: Not on file  . Transportation needs:    Medical: Not on file    Non-medical: Not on file  Tobacco Use  . Smoking status: Former Smoker    Types: Cigarettes  . Smokeless tobacco: Current User    Types: Snuff    Last attempt to quit: 04/28/2012  Substance and Sexual Activity  . Alcohol use: No    Alcohol/week: 0.0 standard drinks  . Drug use: No  . Sexual activity: Yes    Birth control/protection: None  Lifestyle  . Physical activity:    Days per week: Not on file  Minutes per session: Not on file  . Stress: Not on file  Relationships  . Social connections:    Talks on phone: Not on file    Gets together: Not on file    Attends religious service: Not on file    Active member of club or organization: Not on file    Attends meetings of clubs or organizations: Not on file    Relationship status: Not on file  . Intimate partner violence:    Fear of current or ex partner: Not on file    Emotionally abused: Not on file    Physically abused: Not on file    Forced sexual activity: Not on file  Other Topics Concern  . Not on file  Social History Narrative  . Not on file    Family History  Problem Relation Age of Onset  . Asthma Sister   . Allergies Sister   . Heart disease Maternal  Grandfather   . Heart disease Paternal Grandfather      Review of Systems  Constitutional: Negative.  Negative for chills, fever and weight loss.  HENT: Negative.  Negative for congestion, nosebleeds and sore throat.   Eyes: Negative.  Negative for blurred vision and double vision.  Respiratory: Negative.  Negative for cough, hemoptysis, shortness of breath and wheezing.   Cardiovascular: Negative.  Negative for chest pain and palpitations.  Gastrointestinal: Negative.  Negative for abdominal pain, blood in stool, diarrhea, melena, nausea and vomiting.  Genitourinary: Negative.  Negative for dysuria and hematuria.  Musculoskeletal: Positive for joint pain (Left hip).  Skin: Negative.  Negative for rash.  Neurological: Negative.  Negative for dizziness and headaches.  Endo/Heme/Allergies: Negative.   All other systems reviewed and are negative.   Vitals:   09/10/18 0903 09/10/18 1008  BP: (!) 152/82 (!) 160/80  Pulse: 78   Resp: 16   Temp: 99.3 F (37.4 C)   SpO2: 96%     Physical Exam  Constitutional: He is oriented to person, place, and time. He appears well-developed and well-nourished.  HENT:  Head: Normocephalic and atraumatic.  Nose: Nose normal.  Mouth/Throat: Oropharynx is clear and moist.  Eyes: Pupils are equal, round, and reactive to light. Conjunctivae and EOM are normal.  Neck: Normal range of motion. Neck supple.  Cardiovascular: Normal rate, regular rhythm and normal heart sounds.  Pulmonary/Chest: Effort normal and breath sounds normal.  Abdominal: Soft. Bowel sounds are normal. He exhibits no distension. There is no tenderness.  Musculoskeletal: He exhibits no edema or tenderness.  Left hip: No tenderness.  Limited range of motion due to pain.  Neurological: He is alert and oriented to person, place, and time. No sensory deficit. He exhibits normal muscle tone.  Skin: Skin is warm and dry. Capillary refill takes less than 2 seconds. No rash noted.    Psychiatric: He has a normal mood and affect. His behavior is normal.  Vitals reviewed.   Dg Hip Unilat W Or W/o Pelvis 2-3 Views Left  Result Date: 09/10/2018 CLINICAL DATA:  Left-sided hip pain, no known injury, initial encounter EXAM: DG HIP (WITH OR WITHOUT PELVIS) 3V LEFT COMPARISON:  None. FINDINGS: Pelvic ring is intact. Postsurgical changes are seen. Degenerative changes of the hip joints are noted bilaterally worse on the left than the right with subchondral sclerosis and cyst formation. Loss of the superior joint space is noted. Mild degenerative changes of the lumbar spine are seen. No acute fracture is noted IMPRESSION: Significant degenerative changes left greater  than right. No acute fracture is seen. Electronically Signed   By: Alcide Clever M.D.   On: 09/10/2018 09:41   A total of 40 minutes was spent in the room with the patient, greater than 50% of which was in counseling/coordination of care regarding differential diagnosis, treatment, medications, and need for follow-up with orthopedist.  ASSESSMENT & PLAN: Foye was seen today for hip pain and medication refill.  Diagnoses and all orders for this visit:  Left hip pain -     DG HIP UNILAT W OR W/O PELVIS 2-3 VIEWS LEFT; Future -     Ambulatory referral to Orthopedic Surgery  Osteoarthritis of left hip, unspecified osteoarthritis type -     Ambulatory referral to Orthopedic Surgery  Essential hypertension -     lisinopril-hydrochlorothiazide (PRINZIDE,ZESTORETIC) 20-25 MG tablet; Take 1 tablet by mouth daily.  RAD (reactive airway disease), mild persistent, uncomplicated -     albuterol (PROAIR HFA) 108 (90 Base) MCG/ACT inhaler; Inhale 2 puffs into the lungs every 6 (six) hours as needed for wheezing or shortness of breath. -     Fluticasone-Salmeterol (ADVAIR DISKUS) 100-50 MCG/DOSE AEPB; Inhale 1 puff into the lungs 2 (two) times daily.  Mixed hyperlipidemia -     atorvastatin (LIPITOR) 20 MG tablet; Take 1  tablet (20 mg total) by mouth daily.    Patient Instructions       If you have lab work done today you will be contacted with your lab results within the next 2 weeks.  If you have not heard from [place] then please contact us. The fastest way to get your results is to register for My Chart.   IF you received an x-ray today, you will receive an invoice from [facility]. Please contact [facility] at [phone] with questions or concerns regarding your invoice.   IF you received labwork today, you will receive an invoice from [place]. Please contact [facility] at [phone] with questions or concerns regarding your invoice.   Our billing staff will not be able to assist you with questions regarding bills from these companies.  You will be contacted with the lab results as soon as they are available. The fastest way to get your results is to activate your My Chart account. Instructions are located on the last page of this paperwork. If you have not heard from [place] regarding the results in 2 weeks, please contact this office.     Hip Pain The hip is the joint between the upper legs and the lower pelvis. The bones, cartilage, tendons, and muscles of your hip joint support your body and allow you to move around. Hip pain can range from a minor ache to severe pain in one or both of your hips. The pain may be felt on the inside of the hip joint near the groin, or the outside near the buttocks and upper thigh. You may also have swelling or stiffness. Follow these instructions at home: Managing pain, stiffness, and swelling  If directed, apply ice to the injured area. ? Put ice in a plastic bag. ? Place a towel between your skin and the bag. ? Leave the ice on for 20 minutes, 2-3 times a day  Sleep with a pillow between your legs on your most comfortable side.  Avoid any activities that cause pain. General instructions  Take over-the-counter and prescription medicines only  as told by your health care provider.  Do any exercises as told by your health care provider.  Record  the following: ? How often you have hip pain. ? The location of your pain. ? What the pain feels like. ? What makes the pain worse.  Keep all follow-up visits as told by your health care provider. This is important. Contact a health care provider if:  You cannot put weight on your leg.  Your pain or swelling continues or gets worse after one week.  It gets harder to walk.  You have a fever. Get help right away if:  You fall.  You have a sudden increase in pain and swelling in your hip.  Your hip is red or swollen or very tender to touch. Summary  Hip pain can range from a minor ache to severe pain in one or both of your hips.  The pain may be felt on the inside of the hip joint near the groin, or the outside near the buttocks and upper thigh.  Avoid any activities that cause pain.  Record how often you have hip pain, the location of the pain, what makes it worse and what it feels like. This information is not intended to replace advice given to you by your health care provider. Make sure you discuss any questions you have with your health care provider. Document Released: 06/01/2010 Document Revised: 11/14/2016 Document Reviewed: 11/14/2016 Elsevier Interactive Patient Education  2018 Elsevier Inc.      Edwina Barth, MD [facility]

## 2018-09-14 NOTE — Telephone Encounter (Signed)
error 

## 2018-09-14 NOTE — Telephone Encounter (Signed)
Error

## 2018-09-25 ENCOUNTER — Ambulatory Visit (INDEPENDENT_AMBULATORY_CARE_PROVIDER_SITE_OTHER): Payer: BLUE CROSS/BLUE SHIELD | Admitting: Orthopaedic Surgery

## 2018-09-25 ENCOUNTER — Encounter (INDEPENDENT_AMBULATORY_CARE_PROVIDER_SITE_OTHER): Payer: Self-pay | Admitting: Orthopaedic Surgery

## 2018-09-25 DIAGNOSIS — M1611 Unilateral primary osteoarthritis, right hip: Secondary | ICD-10-CM | POA: Diagnosis not present

## 2018-09-25 DIAGNOSIS — M1612 Unilateral primary osteoarthritis, left hip: Secondary | ICD-10-CM

## 2018-09-25 NOTE — Progress Notes (Signed)
Office Visit Note   Patient: Billy Gomez           Date of Birth: 1971-09-04           MRN: [id number] Visit Date: 09/25/2018              Requested by: Georgina Quint, MD [address] [place], [place] [address] PCP: Shade Flood, MD   Assessment & Plan: Visit Diagnoses:  1. Primary osteoarthritis of left hip   2. Primary osteoarthritis of right hip     Plan: We reviewed Billy Gomez x-rays today and unfortunately he has end-stage bilateral hip degenerative joint disease worse on the left.  He now has severe difficulty limitation with ADLs and with his ability to perform his job.  Because he is relatively young and healthy we did discuss the possibility of bilateral total hip replacements.  Risks and benefits and rehab and recovery were discussed.  He will think about this and let us know if and when he wants to proceed.  He denies a history of DVT.  Follow-Up Instructions: Return if symptoms worsen or fail to improve.   Orders:  No orders of the defined types were placed in this encounter.  No orders of the defined types were placed in this encounter.     Procedures: No procedures performed   Clinical Data: No additional findings.   Subjective: Chief Complaint  Patient presents with  . Left Hip - Pain    Billy Gomez is a very pleasant 47 year old gentleman who comes in today with left greater than right bilateral hip pain for many years.  He works as a English as a second language teacher.  He now has severe difficulty with ADLs such as tying his shoelaces or dressing and bathing himself.  He has difficulty with prolonged standing or walking any length of distance.  He denies any back pain.  Denies any numbness and tingling.  He has taken over-the-counter pain meds with temporary and partial relief.   Review of Systems  Constitutional: Negative.   All other systems reviewed and are negative.    Objective: Vital Signs: There were no vitals taken for this  visit.  Physical Exam  Constitutional: He is oriented to person, place, and time. He appears well-developed and well-nourished.  HENT:  Head: Normocephalic and atraumatic.  Eyes: Pupils are equal, round, and reactive to light.  Neck: Neck supple.  Pulmonary/Chest: Effort normal.  Abdominal: Soft.  Musculoskeletal: Normal range of motion.  Neurological: He is alert and oriented to person, place, and time.  Skin: Skin is warm.  Psychiatric: He has a normal mood and affect. His behavior is normal. Judgment and thought content normal.  Nursing note and vitals reviewed.   Ortho Exam Left hip exam shows very limited internal and external rotation.  Positive FADIR.  Positive Stinchfield sign.  Positive logroll.  Trochanteric bursa is nontender.  Negative straight leg  Right hip exam shows slightly better range of motion compared to the left although this is still limited in with pain.  Positive FADIR.  Positive Stinchfield sign.  Positive logroll.  Rotator bursa is nontender.  Negative straight leg. Specialty Comments:  No specialty comments available.  Imaging: No results found.   PMFS History: Patient Active Problem List   Diagnosis Date Noted  . Osteoarthritis of left hip 09/10/2018  . Left hip pain 09/10/2018  . OSA on CPAP 10/18/2016  . Essential hypertension 08/30/2014  . Mixed hyperlipidemia 08/30/2014  . RAD (reactive airway disease),  mild persistent, uncomplicated 08/30/2014  . BMI 35.0-35.9,adult 08/30/2014   Past Medical History:  Diagnosis Date  . Anxiety   . Hypertension     Family History  Problem Relation Age of Onset  . Asthma Sister   . Allergies Sister   . Heart disease Maternal Grandfather   . Heart disease Paternal Grandfather     Past Surgical History:  Procedure Laterality Date  . HERNIA REPAIR    . SPINE SURGERY     Social History   Occupational History  . Not on file  Tobacco Use  . Smoking status: Former Smoker    Types: Cigarettes  .  Smokeless tobacco: Current User    Types: Snuff    Last attempt to quit: 04/28/2012  Substance and Sexual Activity  . Alcohol use: No    Alcohol/week: 0.0 standard drinks  . Drug use: No  . Sexual activity: Yes    Birth control/protection: None

## 2018-10-26 NOTE — Pre-Procedure Instructions (Signed)
Billy Gomez  10/26/2018      [facility] [address] [place] - [address] Billy [address] Billy [place] [address] Phone: [phone] Fax: [phone]    Your procedure is scheduled on November 05, 2018.  Report to [facility] Admitting at 815 AM.  Call this number if you have problems the morning of surgery:  [phone]   Remember:  Do not eat or drink after midnight.    Take these medicines the morning of surgery with A SIP OF WATER  Albuterol inhaler-if needed, bring with you Advair diskus inhaler-if needed  7 days prior to surgery STOP taking any Aspirin (unless otherwise instructed by your surgeon), Aleve, Naproxen, Ibuprofen, Motrin, Advil, Goody's, BC's, all herbal medications, fish oil, and all vitamins   Do not wear jewelry  Do not wear lotions, powders, or colognes, or deodorant.  Men may shave face and neck.  Do not bring valuables to the hospital.  [facility] is not responsible for any belongings or valuables.  Contacts, dentures or bridgework may not be worn into surgery.  Leave your suitcase in the car.  After surgery it may be brought to your room.  For patients admitted to the hospital, discharge time will be determined by your treatment team.  Patients discharged the day of surgery will not be allowed to drive home.    Billy Gomez- Preparing For Surgery  Before surgery, you can play an important role. Because skin is not sterile, your skin needs to be as free of germs as possible. You can reduce the number of germs on your skin by washing with CHG (chlorahexidine gluconate) Soap before surgery.  CHG is an antiseptic cleaner which kills germs and bonds with the skin to continue killing germs even after washing.    Oral Hygiene is also important to reduce your risk of infection.  Remember - BRUSH YOUR TEETH THE MORNING OF SURGERY WITH YOUR REGULAR TOOTHPASTE  Please do not use if you have an allergy to CHG or antibacterial soaps. If  your skin becomes reddened/irritated stop using the CHG.  Do not shave (including legs and underarms) for at least 48 hours prior to first CHG shower. It is OK to shave your face.  Please follow these instructions carefully.   1. Shower the NIGHT BEFORE SURGERY and the MORNING OF SURGERY with CHG.   2. If you chose to wash your hair, wash your hair first as usual with your normal shampoo.  3. After you shampoo, rinse your hair and body thoroughly to remove the shampoo.  4. Use CHG as you would any other liquid soap. You can apply CHG directly to the skin and wash gently with a scrungie or a clean washcloth.   5. Apply the CHG Soap to your body ONLY FROM THE NECK DOWN.  Do not use on open wounds or open sores. Avoid contact with your eyes, ears, mouth and genitals (private parts). Wash Face and genitals (private parts)  with your normal soap.  6. Wash thoroughly, paying special attention to the area where your surgery will be performed.  7. Thoroughly rinse your body with warm water from the neck down.  8. DO NOT shower/wash with your normal soap after using and rinsing off the CHG Soap.  9. Pat yourself dry with a CLEAN TOWEL.  10. Wear CLEAN PAJAMAS to bed the night before surgery, wear comfortable clothes the morning of surgery  11. Place CLEAN SHEETS on  your bed the night of your first shower and DO NOT SLEEP WITH PETS.    Day of Surgery:  Do not apply any deodorants/lotions.  Please wear clean clothes to the hospital/surgery center.   Remember to brush your teeth WITH YOUR REGULAR TOOTHPASTE.  Please read over the following fact sheets that you were given. Pain Booklet, Coughing and Deep Breathing, MRSA Information and Surgical Site Infection Prevention

## 2018-10-29 ENCOUNTER — Encounter (HOSPITAL_COMMUNITY): Payer: Self-pay

## 2018-10-29 ENCOUNTER — Ambulatory Visit (HOSPITAL_COMMUNITY)
Admission: RE | Admit: 2018-10-29 | Discharge: 2018-10-29 | Disposition: A | Discharge status: Home / Self Care | Payer: BLUE CROSS/BLUE SHIELD | Source: Ambulatory Visit | Attending: Physician Assistant | Admitting: Physician Assistant

## 2018-10-29 ENCOUNTER — Encounter (HOSPITAL_COMMUNITY)
Admission: RE | Admit: 2018-10-29 | Discharge: 2018-10-29 | Disposition: A | Discharge status: Home / Self Care | Payer: BLUE CROSS/BLUE SHIELD | Source: Ambulatory Visit | Attending: Orthopaedic Surgery | Admitting: Orthopaedic Surgery

## 2018-10-29 DIAGNOSIS — Z87891 Personal history of nicotine dependence: Secondary | ICD-10-CM | POA: Diagnosis not present

## 2018-10-29 DIAGNOSIS — Z01818 Encounter for other preprocedural examination: Secondary | ICD-10-CM | POA: Diagnosis present

## 2018-10-29 DIAGNOSIS — J45909 Unspecified asthma, uncomplicated: Secondary | ICD-10-CM | POA: Insufficient documentation

## 2018-10-29 DIAGNOSIS — I10 Essential (primary) hypertension: Secondary | ICD-10-CM | POA: Insufficient documentation

## 2018-10-29 DIAGNOSIS — Z79899 Other long term (current) drug therapy: Secondary | ICD-10-CM | POA: Insufficient documentation

## 2018-10-29 DIAGNOSIS — G4733 Obstructive sleep apnea (adult) (pediatric): Secondary | ICD-10-CM | POA: Diagnosis not present

## 2018-10-29 DIAGNOSIS — M160 Bilateral primary osteoarthritis of hip: Secondary | ICD-10-CM | POA: Insufficient documentation

## 2018-10-29 DIAGNOSIS — F909 Attention-deficit hyperactivity disorder, unspecified type: Secondary | ICD-10-CM | POA: Insufficient documentation

## 2018-10-29 HISTORY — DX: Unspecified asthma, uncomplicated: J45.909

## 2018-10-29 HISTORY — DX: Sleep apnea, unspecified: G47.30

## 2018-10-29 HISTORY — DX: Attention-deficit hyperactivity disorder, unspecified type: F90.9

## 2018-10-29 LAB — CBC WITH DIFFERENTIAL/PLATELET
ABS IMMATURE GRANULOCYTES: 0.02 10*3/uL (ref 0.00–0.07)
BASOS ABS: 0.1 10*3/uL (ref 0.0–0.1)
Basophils Relative: 1 %
EOS ABS: 0.4 10*3/uL (ref 0.0–0.5)
Eosinophils Relative: 5 %
HEMATOCRIT: 43.4 % (ref 39.0–52.0)
HEMOGLOBIN: 14.7 g/dL (ref 13.0–17.0)
IMMATURE GRANULOCYTES: 0 %
LYMPHS ABS: 1.3 10*3/uL (ref 0.7–4.0)
LYMPHS PCT: 16 %
MCH: 32.8 pg (ref 26.0–34.0)
MCHC: 33.9 g/dL (ref 30.0–36.0)
MCV: 96.9 fL (ref 80.0–100.0)
Monocytes Absolute: 0.7 10*3/uL (ref 0.1–1.0)
Monocytes Relative: 9 %
NEUTROS ABS: 5.6 10*3/uL (ref 1.7–7.7)
NEUTROS PCT: 69 %
Platelets: 172 10*3/uL (ref 150–400)
RBC: 4.48 MIL/uL (ref 4.22–5.81)
RDW: 12.4 % (ref 11.5–15.5)
WBC: 8.2 10*3/uL (ref 4.0–10.5)
nRBC: 0 % (ref 0.0–0.2)

## 2018-10-29 LAB — PROTIME-INR
INR: 0.93
Prothrombin Time: 12.4 seconds (ref 11.4–15.2)

## 2018-10-29 LAB — COMPREHENSIVE METABOLIC PANEL
ALBUMIN: 4.1 g/dL (ref 3.5–5.0)
ALT: 32 U/L (ref 0–44)
AST: 26 U/L (ref 15–41)
Alkaline Phosphatase: 54 U/L (ref 38–126)
Anion gap: 8 (ref 5–15)
BILIRUBIN TOTAL: 1 mg/dL (ref 0.3–1.2)
BUN: 19 mg/dL (ref 6–20)
CO2: 24 mmol/L (ref 22–32)
CREATININE: 0.79 mg/dL (ref 0.61–1.24)
Calcium: 9.3 mg/dL (ref 8.9–10.3)
Chloride: 109 mmol/L (ref 98–111)
GFR calc Af Amer: >60 mL/min (ref >60)
Glucose, Bld: 111 mg/dL — ABNORMAL HIGH (ref 70–99)
POTASSIUM: 3.6 mmol/L (ref 3.5–5.1)
Sodium: 141 mmol/L (ref 135–145)
TOTAL PROTEIN: 6.4 g/dL — AB (ref 6.5–8.1)

## 2018-10-29 LAB — SURGICAL PCR SCREEN
MRSA, PCR: NEGATIVE
STAPHYLOCOCCUS AUREUS: POSITIVE — AB

## 2018-10-29 LAB — APTT: aPTT: 25 seconds (ref 24–36)

## 2018-10-29 LAB — TYPE AND SCREEN
ABO/RH(D): A POS
ANTIBODY SCREEN: NEGATIVE

## 2018-10-29 LAB — ABO/RH: ABO/RH(D): A POS

## 2018-10-30 NOTE — Progress Notes (Addendum)
Anesthesia Chart Review:  Case:  [id number] Date/Time:  11/05/18 1250   Procedure:  BILATERAL ANTERIOR TOTAL HIP ARTHROPLASTY (Bilateral Hip)   Anesthesia type:  Spinal   Pre-op diagnosis:  bilateral hip osteoarthritis   Location:  [facility] ROOM 04 / [facility]   Surgeon:  Tarry Kos, MD      DISCUSSION: Patient is a 47 year old male scheduled for the above procedure.   History includes former smoker, asthma, HTN, ADHD, OSA (CPAP). BMI is consistent with obesity.   Received a phone call from Sherrie at Dr. Warren Danes office inquiring if patient could be moved to a first case (in the past had heard preference was not to put double joints as first cases). Discussed with anesthesiologist Autumn Patty, MD. Patient should be okay to move to first case if desired by surgeon. Case is posted for spinal anesthesia, but with history of spine surgery, so will attempt to get records from his 2006 [facility] neurosurgery admission Franky Macho, Ronaldo Miyamoto, MD). Update: Based on 07/20/05 operative note, he had semi-hemilaminectomy, progressive discectomy L4-5 for degenerated disk L4-5, displaced disk left L4-5.)   VS: BP (!) 157/92   Pulse 95   Temp 36.9 C   Resp 20   Ht 5' 11.5" (1.816 m)   Wt 113.9 kg   SpO2 95%   BMI 34.55 kg/m   PROVIDERS: Shade Flood, MD is PCP    LABS: Labs reviewed: Acceptable for surgery. (all labs ordered are listed, but only abnormal results are displayed)  Labs Reviewed  SURGICAL PCR SCREEN - Abnormal; Notable for the following components:      Result Value   Staphylococcus aureus POSITIVE (*)    All other components within normal limits  COMPREHENSIVE METABOLIC PANEL - Abnormal; Notable for the following components:   Glucose, Bld 111 (*)    Total Protein 6.4 (*)    All other components within normal limits  APTT  CBC WITH DIFFERENTIAL/PLATELET  PROTIME-INR  TYPE AND SCREEN  ABO/RH    IMAGES: CXR 10/29/18: IMPRESSION: No active cardiopulmonary disease.   EKG:  10/29/18: NSR with sinus arrhythmia. No comparison EKG tracings in Epic or Muse.   CV: N/A  Past Medical History:  Diagnosis Date  . ADHD   . Anxiety   . Asthma   . Hypertension   . Sleep apnea    cpap    Past Surgical History:  Procedure Laterality Date  . EYE SURGERY    . HERNIA REPAIR     x2  . SPINE SURGERY      MEDICATIONS: . albuterol (PROAIR HFA) 108 (90 Base) MCG/ACT inhaler  . atorvastatin (LIPITOR) 20 MG tablet  . Fluticasone-Salmeterol (ADVAIR DISKUS) 100-50 MCG/DOSE AEPB  . ibuprofen (ADVIL,MOTRIN) 200 MG tablet  . lisinopril-hydrochlorothiazide (PRINZIDE,ZESTORETIC) 20-25 MG tablet  . Melatonin 5 MG TABS  . naproxen sodium (ALEVE) 220 MG tablet   No current facility-administered medications for this encounter.     Velna [facility] Short Stay Center/Anesthesiology Phone [phone] 10/30/2018 5:37 PM

## 2018-11-02 MED ORDER — TRANEXAMIC ACID 1000 MG/10ML IV SOLN
2000.0000 mg | INTRAVENOUS | Status: AC
  Administered 2018-11-05: 2000 mg via TOPICAL
  Filled 2018-11-02: qty 20

## 2018-11-02 MED ORDER — TRANEXAMIC ACID-NACL 1000-0.7 MG/100ML-% IV SOLN
1000.0000 mg | INTRAVENOUS | Status: AC
  Administered 2018-11-05: 1000 mg via INTRAVENOUS
  Filled 2018-11-02: qty 100

## 2018-11-04 NOTE — Anesthesia Preprocedure Evaluation (Addendum)
Anesthesia Evaluation  Patient identified by MRN, date of birth, ID band Patient awake    Reviewed: Allergy & Precautions, NPO status , Patient's Chart, lab work & pertinent test results  Airway Mallampati: II  TM Distance: >3 FB Neck ROM: Full    Dental no notable dental hx.    Pulmonary asthma , sleep apnea and Continuous Positive Airway Pressure Ventilation , former smoker,    Pulmonary exam normal breath sounds clear to auscultation       Cardiovascular hypertension, Pt. on medications Normal cardiovascular exam Rhythm:Regular Rate:Normal  ECG: NSR, rate 84   Neuro/Psych PSYCHIATRIC DISORDERS Anxiety ADHDnegative neurological ROS     GI/Hepatic negative GI ROS, Neg liver ROS,   Endo/Other  negative endocrine ROS  Renal/GU negative Renal ROS     Musculoskeletal  (+) Arthritis , Osteoarthritis,    Abdominal (+) + obese,   Peds  Hematology HLD   Anesthesia Other Findings Bilateral hip osteoarthritis  Reproductive/Obstetrics                            Anesthesia Physical Anesthesia Plan  ASA: III  Anesthesia Plan: Spinal   Post-op Pain Management:    Induction: Intravenous  PONV Risk Score and Plan: 3 and Ondansetron, Dexamethasone, Midazolam and Treatment may vary due to age or medical condition  Airway Management Planned:   Additional Equipment:   Intra-op Plan:   Post-operative Plan:   Informed Consent: I have reviewed the patients History and Physical, chart, labs and discussed the procedure including the risks, benefits and alternatives for the proposed anesthesia with the patient or authorized representative who has indicated his/her understanding and acceptance.   Dental advisory given  Plan Discussed with: CRNA  Anesthesia Plan Comments:        Anesthesia Quick Evaluation

## 2018-11-05 ENCOUNTER — Encounter (HOSPITAL_COMMUNITY): Payer: Self-pay | Admitting: *Deleted

## 2018-11-05 ENCOUNTER — Inpatient Hospital Stay (HOSPITAL_COMMUNITY): Payer: BLUE CROSS/BLUE SHIELD

## 2018-11-05 ENCOUNTER — Inpatient Hospital Stay (HOSPITAL_COMMUNITY)
Admission: RE | Admit: 2018-11-05 | Discharge: 2018-11-07 | DRG: 462 | Disposition: A | Discharge status: Home Health | Payer: BLUE CROSS/BLUE SHIELD | Attending: Orthopaedic Surgery | Admitting: Orthopaedic Surgery

## 2018-11-05 ENCOUNTER — Inpatient Hospital Stay (HOSPITAL_COMMUNITY): Payer: BLUE CROSS/BLUE SHIELD | Admitting: Vascular Surgery

## 2018-11-05 ENCOUNTER — Other Ambulatory Visit: Payer: Self-pay

## 2018-11-05 ENCOUNTER — Encounter (HOSPITAL_COMMUNITY)
Admission: RE | Disposition: A | Discharge status: Home Health | Payer: Self-pay | Source: Home / Self Care | Attending: Orthopaedic Surgery

## 2018-11-05 ENCOUNTER — Inpatient Hospital Stay (HOSPITAL_COMMUNITY): Payer: BLUE CROSS/BLUE SHIELD | Admitting: Anesthesiology

## 2018-11-05 DIAGNOSIS — Z6834 Body mass index (BMI) 34.0-34.9, adult: Secondary | ICD-10-CM

## 2018-11-05 DIAGNOSIS — Z8249 Family history of ischemic heart disease and other diseases of the circulatory system: Secondary | ICD-10-CM | POA: Diagnosis not present

## 2018-11-05 DIAGNOSIS — G4730 Sleep apnea, unspecified: Secondary | ICD-10-CM | POA: Diagnosis present

## 2018-11-05 DIAGNOSIS — M1612 Unilateral primary osteoarthritis, left hip: Secondary | ICD-10-CM | POA: Diagnosis present

## 2018-11-05 DIAGNOSIS — Z7951 Long term (current) use of inhaled steroids: Secondary | ICD-10-CM

## 2018-11-05 DIAGNOSIS — Z79899 Other long term (current) drug therapy: Secondary | ICD-10-CM | POA: Diagnosis not present

## 2018-11-05 DIAGNOSIS — M160 Bilateral primary osteoarthritis of hip: Principal | ICD-10-CM | POA: Diagnosis present

## 2018-11-05 DIAGNOSIS — D62 Acute posthemorrhagic anemia: Secondary | ICD-10-CM | POA: Diagnosis not present

## 2018-11-05 DIAGNOSIS — F419 Anxiety disorder, unspecified: Secondary | ICD-10-CM | POA: Diagnosis present

## 2018-11-05 DIAGNOSIS — I10 Essential (primary) hypertension: Secondary | ICD-10-CM | POA: Diagnosis present

## 2018-11-05 DIAGNOSIS — M1611 Unilateral primary osteoarthritis, right hip: Secondary | ICD-10-CM

## 2018-11-05 DIAGNOSIS — E876 Hypokalemia: Secondary | ICD-10-CM | POA: Diagnosis not present

## 2018-11-05 DIAGNOSIS — Z419 Encounter for procedure for purposes other than remedying health state, unspecified: Secondary | ICD-10-CM

## 2018-11-05 DIAGNOSIS — E785 Hyperlipidemia, unspecified: Secondary | ICD-10-CM | POA: Diagnosis present

## 2018-11-05 DIAGNOSIS — E669 Obesity, unspecified: Secondary | ICD-10-CM | POA: Diagnosis present

## 2018-11-05 DIAGNOSIS — Z87891 Personal history of nicotine dependence: Secondary | ICD-10-CM | POA: Diagnosis not present

## 2018-11-05 DIAGNOSIS — F909 Attention-deficit hyperactivity disorder, unspecified type: Secondary | ICD-10-CM | POA: Diagnosis present

## 2018-11-05 DIAGNOSIS — Z79890 Hormone replacement therapy (postmenopausal): Secondary | ICD-10-CM

## 2018-11-05 DIAGNOSIS — Z96649 Presence of unspecified artificial hip joint: Secondary | ICD-10-CM

## 2018-11-05 HISTORY — PX: BILATERAL ANTERIOR TOTAL HIP ARTHROPLASTY: SHX5567

## 2018-11-05 SURGERY — ARTHROPLASTY, HIP, BILATERAL, TOTAL, ANTERIOR APPROACH
Anesthesia: Spinal | Site: Hip | Laterality: Bilateral

## 2018-11-05 MED ORDER — FENTANYL CITRATE (PF) 250 MCG/5ML IJ SOLN
INTRAMUSCULAR | Status: AC
  Filled 2018-11-05: qty 5

## 2018-11-05 MED ORDER — OXYCODONE HCL ER 10 MG PO T12A
10.0000 mg | EXTENDED_RELEASE_TABLET | Freq: Two times a day (BID) | ORAL | Status: DC
  Administered 2018-11-05 – 2018-11-06 (×3): 10 mg via ORAL
  Filled 2018-11-05 (×3): qty 1

## 2018-11-05 MED ORDER — DEXAMETHASONE SODIUM PHOSPHATE 10 MG/ML IJ SOLN
10.0000 mg | Freq: Once | INTRAMUSCULAR | Status: AC
  Administered 2018-11-06: 10 mg via INTRAVENOUS
  Filled 2018-11-05: qty 1

## 2018-11-05 MED ORDER — PROPOFOL 10 MG/ML IV BOLUS
INTRAVENOUS | Status: DC | PRN
  Administered 2018-11-05 (×2): 20 mg via INTRAVENOUS

## 2018-11-05 MED ORDER — FENTANYL CITRATE (PF) 100 MCG/2ML IJ SOLN
INTRAMUSCULAR | Status: DC | PRN
  Administered 2018-11-05: 50 ug via INTRAVENOUS
  Administered 2018-11-05 (×2): 25 ug via INTRAVENOUS
  Administered 2018-11-05: 50 ug via INTRAVENOUS

## 2018-11-05 MED ORDER — OXYCODONE HCL 5 MG/5ML PO SOLN: 5.0000 mg | Freq: Once | ORAL | Status: DC | PRN

## 2018-11-05 MED ORDER — KETOROLAC TROMETHAMINE 30 MG/ML IJ SOLN
INTRAMUSCULAR | Status: DC | PRN
  Administered 2018-11-05 (×2): 30 mg

## 2018-11-05 MED ORDER — ONDANSETRON HCL 4 MG PO TABS: 4.0000 mg | ORAL_TABLET | Freq: Three times a day (TID) | ORAL | 0 refills | Status: AC | PRN

## 2018-11-05 MED ORDER — POLYETHYLENE GLYCOL 3350 17 G PO PACK: 17.0000 g | PACK | Freq: Every day | ORAL | Status: DC | PRN

## 2018-11-05 MED ORDER — SODIUM CHLORIDE (PF) 0.9 % IJ SOLN
INTRAMUSCULAR | Status: DC | PRN
  Administered 2018-11-05 (×4): 10 mL

## 2018-11-05 MED ORDER — OXYCODONE HCL 5 MG PO TABS
10.0000 mg | ORAL_TABLET | ORAL | Status: DC | PRN
  Administered 2018-11-05 – 2018-11-07 (×3): 10 mg via ORAL
  Filled 2018-11-05: qty 2

## 2018-11-05 MED ORDER — 0.9 % SODIUM CHLORIDE (POUR BTL) OPTIME
TOPICAL | Status: DC | PRN
  Administered 2018-11-05 (×2): 1000 mL

## 2018-11-05 MED ORDER — PROMETHAZINE HCL 25 MG/ML IJ SOLN
6.2500 mg | INTRAMUSCULAR | Status: DC | PRN
  Administered 2018-11-05: 6.25 mg via INTRAVENOUS

## 2018-11-05 MED ORDER — HYDROMORPHONE HCL 1 MG/ML IJ SOLN: 0.5000 mg | INTRAMUSCULAR | Status: DC | PRN

## 2018-11-05 MED ORDER — ONDANSETRON HCL 4 MG/2ML IJ SOLN
4.0000 mg | Freq: Four times a day (QID) | INTRAMUSCULAR | Status: DC | PRN
  Administered 2018-11-05: 4 mg via INTRAVENOUS
  Filled 2018-11-05: qty 2

## 2018-11-05 MED ORDER — VANCOMYCIN HCL 1000 MG IV SOLR
INTRAVENOUS | Status: AC
  Filled 2018-11-05: qty 2000

## 2018-11-05 MED ORDER — ALBUTEROL SULFATE (2.5 MG/3ML) 0.083% IN NEBU: 2.5000 mg | INHALATION_SOLUTION | Freq: Four times a day (QID) | RESPIRATORY_TRACT | Status: DC | PRN

## 2018-11-05 MED ORDER — OXYCODONE HCL 5 MG PO TABS: 5.0000 mg | ORAL_TABLET | ORAL | 0 refills | Status: DC | PRN

## 2018-11-05 MED ORDER — PHENYLEPHRINE 40 MCG/ML (10ML) SYRINGE FOR IV PUSH (FOR BLOOD PRESSURE SUPPORT)
PREFILLED_SYRINGE | INTRAVENOUS | Status: DC | PRN
  Administered 2018-11-05 (×4): 80 ug via INTRAVENOUS

## 2018-11-05 MED ORDER — SULFAMETHOXAZOLE-TRIMETHOPRIM 800-160 MG PO TABS
1.0000 | ORAL_TABLET | Freq: Two times a day (BID) | ORAL | Status: DC
  Administered 2018-11-05 – 2018-11-07 (×4): 1 via ORAL
  Filled 2018-11-05 (×4): qty 1

## 2018-11-05 MED ORDER — MEPERIDINE HCL 50 MG/ML IJ SOLN
12.5000 mg | Freq: Once | INTRAMUSCULAR | Status: AC
  Administered 2018-11-05: 12.5 mg via INTRAVENOUS

## 2018-11-05 MED ORDER — PROPOFOL 10 MG/ML IV BOLUS
INTRAVENOUS | Status: AC
  Filled 2018-11-05: qty 20

## 2018-11-05 MED ORDER — BUPIVACAINE HCL (PF) 0.5 % IJ SOLN
INTRAMUSCULAR | Status: DC | PRN
  Administered 2018-11-05: 3 mL via INTRATHECAL

## 2018-11-05 MED ORDER — PROPOFOL 500 MG/50ML IV EMUL
INTRAVENOUS | Status: DC | PRN
  Administered 2018-11-05: 100 ug/kg/min via INTRAVENOUS

## 2018-11-05 MED ORDER — ONDANSETRON HCL 4 MG/2ML IJ SOLN
INTRAMUSCULAR | Status: DC | PRN
  Administered 2018-11-05: 4 mg via INTRAVENOUS

## 2018-11-05 MED ORDER — METHOCARBAMOL 750 MG PO TABS: 750.0000 mg | ORAL_TABLET | Freq: Two times a day (BID) | ORAL | 0 refills | Status: AC | PRN

## 2018-11-05 MED ORDER — MIDAZOLAM HCL 5 MG/5ML IJ SOLN
INTRAMUSCULAR | Status: DC | PRN
  Administered 2018-11-05: 2 mg via INTRAVENOUS

## 2018-11-05 MED ORDER — SODIUM CHLORIDE 0.9 % IV SOLN
INTRAVENOUS | Status: DC
  Administered 2018-11-05: 16:00:00 via INTRAVENOUS

## 2018-11-05 MED ORDER — CEFAZOLIN SODIUM-DEXTROSE 2-4 GM/100ML-% IV SOLN
2.0000 g | Freq: Four times a day (QID) | INTRAVENOUS | Status: AC
  Administered 2018-11-05 – 2018-11-06 (×3): 2 g via INTRAVENOUS
  Filled 2018-11-05 (×3): qty 100

## 2018-11-05 MED ORDER — PHENYLEPHRINE 40 MCG/ML (10ML) SYRINGE FOR IV PUSH (FOR BLOOD PRESSURE SUPPORT)
PREFILLED_SYRINGE | INTRAVENOUS | Status: AC
  Filled 2018-11-05: qty 10

## 2018-11-05 MED ORDER — SULFAMETHOXAZOLE-TRIMETHOPRIM 800-160 MG PO TABS: 1.0000 | ORAL_TABLET | Freq: Two times a day (BID) | ORAL | 0 refills | Status: AC

## 2018-11-05 MED ORDER — ONDANSETRON HCL 4 MG/2ML IJ SOLN
INTRAMUSCULAR | Status: AC
  Filled 2018-11-05: qty 2

## 2018-11-05 MED ORDER — PHENOL 1.4 % MT LIQD: 1.0000 | OROMUCOSAL | Status: DC | PRN

## 2018-11-05 MED ORDER — ATORVASTATIN CALCIUM 20 MG PO TABS: 20.0000 mg | ORAL_TABLET | Freq: Every day | ORAL | Status: DC

## 2018-11-05 MED ORDER — VANCOMYCIN HCL 1000 MG IV SOLR
INTRAVENOUS | Status: DC | PRN
  Administered 2018-11-05: 1000 mg
  Administered 2018-11-05: 1000 mg via TOPICAL

## 2018-11-05 MED ORDER — ACETAMINOPHEN 500 MG PO TABS
1000.0000 mg | ORAL_TABLET | Freq: Four times a day (QID) | ORAL | Status: AC
  Administered 2018-11-05 – 2018-11-06 (×4): 1000 mg via ORAL
  Filled 2018-11-05 (×4): qty 2

## 2018-11-05 MED ORDER — OXYCODONE HCL 5 MG PO TABS: 5.0000 mg | ORAL_TABLET | Freq: Once | ORAL | Status: DC | PRN

## 2018-11-05 MED ORDER — MELATONIN 3 MG PO TABS
3.0000 mg | ORAL_TABLET | Freq: Every evening | ORAL | Status: DC | PRN
  Filled 2018-11-05: qty 1

## 2018-11-05 MED ORDER — MEPERIDINE HCL 50 MG/ML IJ SOLN
INTRAMUSCULAR | Status: AC
  Filled 2018-11-05: qty 1

## 2018-11-05 MED ORDER — GABAPENTIN 300 MG PO CAPS
300.0000 mg | ORAL_CAPSULE | Freq: Three times a day (TID) | ORAL | Status: DC
  Administered 2018-11-05 – 2018-11-07 (×6): 300 mg via ORAL
  Filled 2018-11-05 (×6): qty 1

## 2018-11-05 MED ORDER — BUPIVACAINE LIPOSOME 1.3 % IJ SUSP
20.0000 mL | INTRAMUSCULAR | Status: AC
  Administered 2018-11-05: 20 mL
  Filled 2018-11-05: qty 20

## 2018-11-05 MED ORDER — SODIUM CHLORIDE 0.9 % IV SOLN
INTRAVENOUS | Status: DC | PRN
  Administered 2018-11-05: 25 ug/min via INTRAVENOUS

## 2018-11-05 MED ORDER — ALBUMIN HUMAN 5 % IV SOLN
INTRAVENOUS | Status: DC | PRN
  Administered 2018-11-05: 09:00:00 via INTRAVENOUS

## 2018-11-05 MED ORDER — ACETAMINOPHEN 325 MG PO TABS: 325.0000 mg | ORAL_TABLET | Freq: Four times a day (QID) | ORAL | Status: DC | PRN

## 2018-11-05 MED ORDER — ALBUTEROL SULFATE HFA 108 (90 BASE) MCG/ACT IN AERS: 2.0000 | INHALATION_SPRAY | Freq: Four times a day (QID) | RESPIRATORY_TRACT | Status: DC | PRN

## 2018-11-05 MED ORDER — RIVAROXABAN 10 MG PO TABS
10.0000 mg | ORAL_TABLET | Freq: Every day | ORAL | Status: DC
  Administered 2018-11-05 – 2018-11-07 (×3): 10 mg via ORAL
  Filled 2018-11-05 (×3): qty 1

## 2018-11-05 MED ORDER — TRANEXAMIC ACID 1000 MG/10ML IV SOLN
2000.0000 mg | INTRAVENOUS | Status: AC
  Administered 2018-11-05: 2000 mg via TOPICAL
  Filled 2018-11-05: qty 20

## 2018-11-05 MED ORDER — LACTATED RINGERS IV SOLN
INTRAVENOUS | Status: DC | PRN
  Administered 2018-11-05 (×2): via INTRAVENOUS

## 2018-11-05 MED ORDER — METHOCARBAMOL 1000 MG/10ML IJ SOLN
500.0000 mg | Freq: Four times a day (QID) | INTRAVENOUS | Status: DC | PRN
  Filled 2018-11-05: qty 5

## 2018-11-05 MED ORDER — MAGNESIUM CITRATE PO SOLN: 1.0000 | Freq: Once | ORAL | Status: DC | PRN

## 2018-11-05 MED ORDER — MIDAZOLAM HCL 2 MG/2ML IJ SOLN
INTRAMUSCULAR | Status: AC
  Filled 2018-11-05: qty 2

## 2018-11-05 MED ORDER — PROMETHAZINE HCL 25 MG PO TABS: 25.0000 mg | ORAL_TABLET | Freq: Four times a day (QID) | ORAL | 1 refills | Status: AC | PRN

## 2018-11-05 MED ORDER — OXYCODONE HCL ER 10 MG PO T12A: 10.0000 mg | EXTENDED_RELEASE_TABLET | Freq: Two times a day (BID) | ORAL | 0 refills | Status: AC

## 2018-11-05 MED ORDER — OXYCODONE HCL 5 MG PO TABS
5.0000 mg | ORAL_TABLET | ORAL | Status: DC | PRN
  Administered 2018-11-06: 10 mg via ORAL
  Filled 2018-11-05 (×3): qty 2

## 2018-11-05 MED ORDER — CHLORHEXIDINE GLUCONATE 4 % EX LIQD: 60.0000 mL | Freq: Once | CUTANEOUS | Status: DC

## 2018-11-05 MED ORDER — ONDANSETRON HCL 4 MG PO TABS: 4.0000 mg | ORAL_TABLET | Freq: Four times a day (QID) | ORAL | Status: DC | PRN

## 2018-11-05 MED ORDER — MOMETASONE FURO-FORMOTEROL FUM 100-5 MCG/ACT IN AERO
2.0000 | INHALATION_SPRAY | Freq: Two times a day (BID) | RESPIRATORY_TRACT | Status: DC
  Filled 2018-11-05: qty 8.8

## 2018-11-05 MED ORDER — CEFAZOLIN SODIUM-DEXTROSE 2-4 GM/100ML-% IV SOLN
2.0000 g | INTRAVENOUS | Status: AC
  Administered 2018-11-05: 2 g via INTRAVENOUS
  Filled 2018-11-05: qty 100

## 2018-11-05 MED ORDER — PROMETHAZINE HCL 25 MG/ML IJ SOLN
INTRAMUSCULAR | Status: AC
  Filled 2018-11-05: qty 1

## 2018-11-05 MED ORDER — ALUM & MAG HYDROXIDE-SIMETH 200-200-20 MG/5ML PO SUSP: 30.0000 mL | ORAL | Status: DC | PRN

## 2018-11-05 MED ORDER — TRANEXAMIC ACID-NACL 1000-0.7 MG/100ML-% IV SOLN
1000.0000 mg | Freq: Once | INTRAVENOUS | Status: AC
  Administered 2018-11-05: 1000 mg via INTRAVENOUS
  Filled 2018-11-05: qty 100

## 2018-11-05 MED ORDER — MENTHOL 3 MG MT LOZG: 1.0000 | LOZENGE | OROMUCOSAL | Status: DC | PRN

## 2018-11-05 MED ORDER — SORBITOL 70 % SOLN: 30.0000 mL | Freq: Every day | Status: DC | PRN

## 2018-11-05 MED ORDER — KETOROLAC TROMETHAMINE 15 MG/ML IJ SOLN
30.0000 mg | Freq: Four times a day (QID) | INTRAMUSCULAR | Status: AC
  Administered 2018-11-05 – 2018-11-06 (×4): 30 mg via INTRAVENOUS
  Filled 2018-11-05 (×4): qty 2

## 2018-11-05 MED ORDER — RIVAROXABAN 10 MG PO TABS: 10.0000 mg | ORAL_TABLET | Freq: Every day | ORAL | 0 refills | Status: AC

## 2018-11-05 MED ORDER — HYDROMORPHONE HCL 1 MG/ML IJ SOLN
0.2500 mg | INTRAMUSCULAR | Status: DC | PRN
  Administered 2018-11-05: 0.5 mg via INTRAVENOUS

## 2018-11-05 MED ORDER — DOCUSATE SODIUM 100 MG PO CAPS
100.0000 mg | ORAL_CAPSULE | Freq: Two times a day (BID) | ORAL | Status: DC
  Administered 2018-11-05 – 2018-11-07 (×4): 100 mg via ORAL
  Filled 2018-11-05 (×4): qty 1

## 2018-11-05 MED ORDER — LACTATED RINGERS IV SOLN: INTRAVENOUS | Status: DC

## 2018-11-05 MED ORDER — DIPHENHYDRAMINE HCL 12.5 MG/5ML PO ELIX: 25.0000 mg | ORAL_SOLUTION | ORAL | Status: DC | PRN

## 2018-11-05 MED ORDER — HYDROMORPHONE HCL 1 MG/ML IJ SOLN
INTRAMUSCULAR | Status: AC
  Filled 2018-11-05: qty 1

## 2018-11-05 MED ORDER — METOCLOPRAMIDE HCL 5 MG/ML IJ SOLN: 5.0000 mg | Freq: Three times a day (TID) | INTRAMUSCULAR | Status: DC | PRN

## 2018-11-05 MED ORDER — METHOCARBAMOL 500 MG PO TABS
500.0000 mg | ORAL_TABLET | Freq: Four times a day (QID) | ORAL | Status: DC | PRN
  Administered 2018-11-05 – 2018-11-07 (×4): 500 mg via ORAL
  Filled 2018-11-05 (×4): qty 1

## 2018-11-05 MED ORDER — METOCLOPRAMIDE HCL 5 MG PO TABS: 5.0000 mg | ORAL_TABLET | Freq: Three times a day (TID) | ORAL | Status: DC | PRN

## 2018-11-05 MED ORDER — KETOROLAC TROMETHAMINE 30 MG/ML IJ SOLN
INTRAMUSCULAR | Status: AC
  Filled 2018-11-05: qty 2

## 2018-11-05 SURGICAL SUPPLY — 69 items
BAG DECANTER FOR FLEXI CONT (MISCELLANEOUS) ×5 IMPLANT
CELLS DAT CNTRL 66122 CELL SVR (MISCELLANEOUS) ×2 IMPLANT
CLOSURE STERI-STRIP 1/2X4 (GAUZE/BANDAGES/DRESSINGS) ×2
CLSR STERI-STRIP ANTIMIC 1/2X4 (GAUZE/BANDAGES/DRESSINGS) ×2 IMPLANT
CONT SPEC 4OZ CLIKSEAL STRL BL (MISCELLANEOUS) ×4 IMPLANT
COVER SURGICAL LIGHT HANDLE (MISCELLANEOUS) ×5 IMPLANT
COVER WAND RF STERILE (DRAPES) ×5 IMPLANT
CUP ACET PNNCL SECTR W/GRIP 56 (Hips) IMPLANT
CUP SECTOR GRIPTON 58MM (Orthopedic Implant) ×2 IMPLANT
DRAPE C-ARM 42X72 X-RAY (DRAPES) ×6 IMPLANT
DRAPE IMP U-DRAPE 54X76 (DRAPES) ×4 IMPLANT
DRAPE POUCH INSTRU U-SHP 10X18 (DRAPES) ×6 IMPLANT
DRAPE STERI IOBAN 125X83 (DRAPES) ×6 IMPLANT
DRAPE U-SHAPE 47X51 STRL (DRAPES) ×6 IMPLANT
DRSG AQUACEL AG ADV 3.5X10 (GAUZE/BANDAGES/DRESSINGS) ×6 IMPLANT
DURAPREP 26ML APPLICATOR (WOUND CARE) ×11 IMPLANT
ELECT BLADE 4.0 EZ CLEAN MEGAD (MISCELLANEOUS) ×3
ELECT REM PT RETURN 9FT ADLT (ELECTROSURGICAL) ×3
ELECTRODE BLDE 4.0 EZ CLN MEGD (MISCELLANEOUS) ×1 IMPLANT
ELECTRODE REM PT RTRN 9FT ADLT (ELECTROSURGICAL) ×1 IMPLANT
GLOVE BIOGEL PI IND STRL 7.0 (GLOVE) ×2 IMPLANT
GLOVE BIOGEL PI INDICATOR 7.0 (GLOVE) ×4
GLOVE ECLIPSE 7.0 STRL STRAW (GLOVE) ×12 IMPLANT
GLOVE SKINSENSE NS SZ7.5 (GLOVE) ×4
GLOVE SKINSENSE STRL SZ7.5 (GLOVE) ×2 IMPLANT
GLOVE SURG SYN 7.5  E (GLOVE) ×12
GLOVE SURG SYN 7.5 E (GLOVE) ×6 IMPLANT
GLOVE SURG SYN 7.5 PF PI (GLOVE) ×6 IMPLANT
GOWN SRG XL XLNG 56XLVL 4 (GOWN DISPOSABLE) ×2 IMPLANT
GOWN STRL NON-REIN XL XLG LVL4 (GOWN DISPOSABLE) ×6
GOWN STRL REUS W/ TWL LRG LVL3 (GOWN DISPOSABLE) IMPLANT
GOWN STRL REUS W/ TWL XL LVL3 (GOWN DISPOSABLE) ×2 IMPLANT
GOWN STRL REUS W/TWL LRG LVL3 (GOWN DISPOSABLE)
GOWN STRL REUS W/TWL XL LVL3 (GOWN DISPOSABLE) ×6
HANDPIECE INTERPULSE COAX TIP (DISPOSABLE) ×6
HEAD CERAMIC 36 PLUS5 (Hips) ×2 IMPLANT
HEAD CERAMIC DELTA 36 PLUS 1.5 (Hips) ×2 IMPLANT
HOOD PEEL AWAY FLYTE STAYCOOL (MISCELLANEOUS) ×12 IMPLANT
IV NS IRRIG 3000ML ARTHROMATIC (IV SOLUTION) ×5 IMPLANT
KIT BASIN OR (CUSTOM PROCEDURE TRAY) ×3 IMPLANT
LINER NEUTRAL 36X58 PLUS4 ×2 IMPLANT
MARKER SKIN DUAL TIP RULER LAB (MISCELLANEOUS) ×11 IMPLANT
NDL 18GX1X1/2 (RX/OR ONLY) (NEEDLE) IMPLANT
NDL SPNL 18GX3.5 QUINCKE PK (NEEDLE) ×1 IMPLANT
NEEDLE 18GX1X1/2 (RX/OR ONLY) (NEEDLE) ×3 IMPLANT
NEEDLE SPNL 18GX3.5 QUINCKE PK (NEEDLE) ×6 IMPLANT
NS IRRIG 1000ML POUR BTL (IV SOLUTION) ×4 IMPLANT
PACK TOTAL JOINT (CUSTOM PROCEDURE TRAY) ×3 IMPLANT
PACK UNIVERSAL I (CUSTOM PROCEDURE TRAY) ×6 IMPLANT
PINN SECTOR W/GRIP ACE CUP 56 (Hips) ×3 IMPLANT
PINNACLE ALTRX PLUS 4 N 36X56 (Hips) ×2 IMPLANT
RETRACTOR WND ALEXIS 18 MED (MISCELLANEOUS) IMPLANT
RTRCTR WOUND ALEXIS 18CM MED (MISCELLANEOUS) ×6
SAW OSC TIP CART 19.5X105X1.3 (SAW) ×3 IMPLANT
SEALER BIPOLAR AQUA 6.0 (INSTRUMENTS) ×4 IMPLANT
SET HNDPC FAN SPRY TIP SCT (DISPOSABLE) ×2 IMPLANT
STAPLER VISISTAT 35W (STAPLE) IMPLANT
STEM FEMORAL SZ6 HIGH ACTIS (Stem) ×4 IMPLANT
SUT ETHIBOND 2 V 37 (SUTURE) ×6 IMPLANT
SUT MNCRL AB 3-0 PS2 18 (SUTURE) ×4 IMPLANT
SUT VIC AB 1 CT1 27 (SUTURE) ×6
SUT VIC AB 1 CT1 27XBRD ANBCTR (SUTURE) ×2 IMPLANT
SUT VIC AB 2-0 CT1 27 (SUTURE) ×24
SUT VIC AB 2-0 CT1 TAPERPNT 27 (SUTURE) ×4 IMPLANT
SYR TB 1ML LUER SLIP (SYRINGE) ×2 IMPLANT
SYRINGE 60CC LL (MISCELLANEOUS) ×5 IMPLANT
TOWEL OR 17X26 10 PK STRL BLUE (TOWEL DISPOSABLE) ×3 IMPLANT
TRAY CATH 16FR W/PLASTIC CATH (SET/KITS/TRAYS/PACK) IMPLANT
YANKAUER SUCT BULB TIP NO VENT (SUCTIONS) ×3 IMPLANT

## 2018-11-05 NOTE — Op Note (Addendum)
BILATERAL ANTERIOR TOTAL HIP ARTHROPLASTY  Procedure Note KAULDER ZAHNER   [id number]  Pre-op Diagnosis: Bilateral hip osteoarthritis     Post-op Diagnosis: same   Operative Procedures  1. Left total hip replacement; cpt-27130  2. Right total hip replacement; cpt-27130  Personnel  Surgeon(s): Tarry Kos, MD  Assist: Oneal Grout, PA-C; necessary for the timely completion of procedure and due to complexity of procedure.   Anesthesia: spinal  Left hip: Prosthesis: Depuy Acetabulum: Pinnacle 58 mm Femur: Actis 6 HO Head: 36 mm size: +5 Liner: +4 neutral Bearing Type: ceramic on poly  Right hip: Prosthesis: Depuy Acetabulum: Pinnacle 56 mm Femur: Actis 6 HO Head: 36 mm size: +1.5 Liner: +4 neutral Bearing Type: ceramic on poly  Total Hip Arthroplasty (Anterior Approach) Op Note:  After informed consent was obtained and the operative extremity marked in the holding area, the patient was brought back to the operating room and placed supine on the HANA table. Next, the operative extremity was prepped and draped in normal sterile fashion. Surgical timeout occurred verifying patient identification, surgical site, surgical procedure and administration of antibiotics.  We first began with the left total hip replacement. A modified anterior Smith-Peterson approach to the hip was performed, using the interval between tensor fascia lata and sartorius.  Dissection was carried bluntly down onto the anterior hip capsule. The lateral femoral circumflex vessels were identified and coagulated. A capsulotomy was performed and the capsular flaps tagged for later repair.  Fluoroscopy was utilized to prepare for the femoral neck cut. The neck osteotomy was performed. The femoral head was removed, the acetabular rim was cleared of soft tissue and attention was turned to reaming the acetabulum.  Sequential reaming was performed under fluoroscopic guidance. We reamed to a size 57 mm, and  then impacted the acetabular shell. The liner was then placed after irrigation and attention turned to the femur.  After placing the femoral hook, the leg was taken to externally rotated, extended and adducted position taking care to perform soft tissue releases to allow for adequate mobilization of the femur. Soft tissue was cleared from the shoulder of the greater trochanter and the hook elevator used to improve exposure of the proximal femur. Sequential broaching performed up to a size 6. Trial neck and head were placed. The leg was brought back up to neutral and the construct reduced. The position and sizing of components, offset and leg lengths were checked using fluoroscopy. Stability of the construct was checked in extension and external rotation without any subluxation or impingement of prosthesis. We dislocated the prosthesis, dropped the leg back into position, removed trial components, and irrigated copiously. The final stem and head was then placed, the leg brought back up, the system reduced and fluoroscopy used to verify positioning.  We irrigated, obtained hemostasis and closed the capsule using #2 ethibond suture.  One gram of vancomycin powder was placed in the surgical bed. The fascia was closed with #1 vicryl plus, the deep fat layer was closed with 0 vicryl, the subcutaneous layers closed with 2.0 Vicryl Plus and the skin closed with 3.0 monocryl and steri strips. A sterile dressing was applied.  The tolerated the procedure well and we agreed that the it was appropriate to proceed with the right total hip replacement. We then performed the identical steps for the right total hip replacement.  The component sizes that were used are listed above. The patient was awakened in the operating room and taken to recovery in  stable condition.  All sponge, needle, and instrument counts were correct at the end of the case.   Position: supine  Complications: none.  Time Out: performed    Drains/Packing: none  Estimated blood loss: see anesthesia record  Returned to Recovery Room: in good condition.   Antibiotics: yes   Mechanical VTE (DVT) Prophylaxis: sequential compression devices, TED thigh-high  Chemical VTE (DVT) Prophylaxis: xarelto   Fluid Replacement: see anesthesia record  Specimens Removed: 1 to pathology   Sponge and Instrument Count Correct? yes   PACU: portable radiograph - low AP   Admission: inpatient status  Plan/RTC: Return in 2 weeks for staple removal. Weight Bearing/Load Lower Extremity: full  Hip precautions: none  N. Glee Arvin, MD [facility] [phone] 2:28 PM     Implant Name Type Inv. Item Serial No. Manufacturer Lot No. LRB No. Used  LINER NEUTRAL [id number] PLUS4 - [id number]  LINER NEUTRAL 57X36MM PLUS4  DEPUY SYNTHES [id number]  1  CUP SECTOR GRIPTON - [id number] Orthopedic Implant CUP SECTOR GRIPTON  DEPUY SYNTHES [id number]  1  HEAD CERAMIC 36 PLUS5 - [id number] Hips HEAD CERAMIC 36 PLUS5  DEPUY SYNTHES [id number]  1  femoral stem 12/14 taper actis duofix hip prosthesis cementless     DEPUY ORTHOPAEDICS [id number]  1  PINN SECTOR W/GRIP ACE CUP 56 - [id number] Hips PINN SECTOR W/GRIP ACE CUP 56  DEPUY SYNTHES [id number]  1  PINNACLE ALTRX PLUS 4 N 36X56 - [id number] Hips PINNACLE ALTRX PLUS 4 N 36X56  DEPUY SYNTHES [id number]  1  HEAD CERAMIC DELTA 36 PLUS 1.5 - [id number] Hips HEAD CERAMIC DELTA 36 PLUS 1.5  DEPUY SYNTHES [id number]  1  femoral stem 12/14 taper actis duofix hip prosthesis cementless      [id number]  1

## 2018-11-05 NOTE — Anesthesia Procedure Notes (Signed)
Spinal  Patient location during procedure: OR Start time: 11/05/2018 7:40 AM End time: 11/05/2018 7:50 AM Staffing Anesthesiologist: Leonides Grills, MD Performed: anesthesiologist  Preanesthetic Checklist Completed: patient identified, surgical consent, pre-op evaluation, timeout performed, IV checked, risks and benefits discussed and monitors and equipment checked Spinal Block Patient position: sitting Prep: DuraPrep Patient monitoring: cardiac monitor, continuous pulse ox and blood pressure Approach: left paramedian Location: L3-4 Injection technique: single-shot Needle Needle type: Pencan  Needle gauge: 24 G Needle length: 9 cm Assessment Sensory level: T10 Additional Notes Functioning IV was confirmed and monitors were applied. Sterile prep and drape, including hand hygiene and sterile gloves were used. The patient was positioned and the spine was prepped. The skin was anesthetized with lidocaine.  Free flow of clear CSF was obtained prior to injecting local anesthetic into the CSF.  The spinal needle aspirated freely following injection.  The needle was carefully withdrawn.  The patient tolerated the procedure well.

## 2018-11-05 NOTE — Anesthesia Procedure Notes (Signed)
Procedure Name: [place] Performed by: Inda Coke, CRNA Pre-anesthesia Checklist: Patient identified, Emergency Drugs available, Suction available, Timeout performed and Patient being monitored Patient Re-evaluated:Patient Re-evaluated prior to induction Oxygen Delivery Method: Simple face mask Induction Type: IV induction Dental Injury: Teeth and Oropharynx as per pre-operative assessment

## 2018-11-05 NOTE — Anesthesia Postprocedure Evaluation (Signed)
Anesthesia Post Note  Patient: Billy Gomez  Procedure(s) Performed: BILATERAL ANTERIOR TOTAL HIP ARTHROPLASTY (Bilateral Hip)     Patient location during evaluation: PACU Anesthesia Type: Spinal Level of consciousness: awake and alert Pain management: pain level controlled Vital Signs Assessment: post-procedure vital signs reviewed and stable Respiratory status: spontaneous breathing Cardiovascular status: stable Postop Assessment: spinal receding Anesthetic complications: no    Last Vitals:  Vitals:   11/05/18 1416 11/05/18 1458  BP: 102/62 (!) 109/58  Pulse: 66 70  Resp: 14 16  Temp: (!) 36.2 C 36.7 C  SpO2: 93% 96%    Last Pain:  Vitals:   11/05/18 1458  TempSrc: Oral  PainSc: 0-No pain                 Lewie Loron

## 2018-11-05 NOTE — Transfer of Care (Signed)
Immediate Anesthesia Transfer of Care Note  Patient: Billy Gomez  Procedure(s) Performed: BILATERAL ANTERIOR TOTAL HIP ARTHROPLASTY (Bilateral Hip)  Patient Location: PACU  Anesthesia Type:MAC and Spinal  Level of Consciousness: awake, alert  and oriented  Airway & Oxygen Therapy: Patient Spontanous Breathing and Patient connected to nasal cannula oxygen  Post-op Assessment: Report given to RN and Post -op Vital signs reviewed and stable  Post vital signs: Reviewed and stable  Last Vitals:  Vitals Value Taken Time  BP 78/49 11/05/2018 12:03 PM  Temp    Pulse 64 11/05/2018 12:08 PM  Resp 14 11/05/2018 12:08 PM  SpO2 100 % 11/05/2018 12:08 PM  Vitals shown include unvalidated device data.  Last Pain:  Vitals:   11/05/18 0631  TempSrc: Oral  PainSc:       Patients Stated Pain Goal: 3 (20/10/07 1219)  Complications: No apparent anesthesia complications

## 2018-11-05 NOTE — Progress Notes (Signed)
Patient refused CPAP for tonight. RT informed patient to have RT called if he changes his mind. RT will monitor as needed. 

## 2018-11-05 NOTE — Progress Notes (Signed)
Patient refused continuous pulse monitoring, education provided, O2 sat 96% room air , pulse 76, will continue to monitor patient.

## 2018-11-05 NOTE — Evaluation (Signed)
Physical Therapy Evaluation Patient Details Name: Billy Gomez MRN: [id number] DOB: Apr 06, 1971 Today's Date: 11/05/2018   History of Present Illness  Pt is a 47 y/o male s/p bilateral THA's, direct anterior approach. PMH includes asthma and HTN.   Clinical Impression  Pt is s/p surgery above with deficits below. Pt tolerated gait fairly well within the room this session. Required min to min guard A for mobility using RW. Anticipate pt will progress well. Will continue to follow acutely to maximize functional mobility independence and safety.     Follow Up Recommendations Follow surgeon's recommendation for DC plan and follow-up therapies;Supervision for mobility/OOB    Equipment Recommendations  None recommended by PT    Recommendations for Other Services       Precautions / Restrictions Precautions Precautions: Fall Restrictions Weight Bearing Restrictions: Yes RLE Weight Bearing: Weight bearing as tolerated LLE Weight Bearing: Weight bearing as tolerated      Mobility  Bed Mobility Overal bed mobility: Needs Assistance Bed Mobility: Supine to Sit     Supine to sit: Min assist     General bed mobility comments: Min A for LE assist and trunk elevation. Increased time required to come to sitting.   Transfers Overall transfer level: Needs assistance Equipment used: Rolling walker (2 wheeled) Transfers: Sit to/from Stand Sit to Stand: Min assist;From elevated surface         General transfer comment: Min A for lift assist and steadying. Increased time required and cues for hand placement.   Ambulation/Gait Ambulation/Gait assistance: Min assist;Min guard Gait Distance (Feet): 15 Feet Assistive device: Rolling walker (2 wheeled) Gait Pattern/deviations: Step-to pattern;Decreased step length - right;Decreased step length - left;Decreased weight shift to right;Decreased weight shift to left;Antalgic Gait velocity: Decreased    General Gait Details: Slow,  antalgic gait. Pt with heavy reliance on UEs given pain in bilat hips. Gait distance limited to within the room.   Stairs            Wheelchair Mobility    Modified Rankin (Stroke Patients Only)       Balance Overall balance assessment: Needs assistance Sitting-balance support: No upper extremity supported;Feet supported Sitting balance-Leahy Scale: Good     Standing balance support: Bilateral upper extremity supported;During functional activity Standing balance-Leahy Scale: Poor Standing balance comment: Reliant on BUE support.                              Pertinent Vitals/Pain Pain Assessment: Faces Faces Pain Scale: Hurts little more Pain Location: bilateral hips Pain Descriptors / Indicators: Aching;Operative site guarding Pain Intervention(s): Limited activity within patient's tolerance;Monitored during session;Repositioned    Home Living Family/patient expects to be discharged to:: Private residence Living Arrangements: Spouse/significant other Available Help at Discharge: Family;Available 24 hours/day Type of Home: House Home Access: Stairs to enter Entrance Stairs-Rails: Right;Left;Can reach both Entrance Stairs-Number of Steps: 2 Home Layout: One level Home Equipment: Walker - 2 wheels;Bedside commode;Tub bench      Prior Function Level of Independence: Independent               Hand Dominance        Extremity/Trunk Assessment   Upper Extremity Assessment Upper Extremity Assessment: Defer to OT evaluation    Lower Extremity Assessment Lower Extremity Assessment: RLE deficits/detail;LLE deficits/detail RLE Deficits / Details: Deficits consistent with post op pain and weakness.  LLE Deficits / Details: Deficits consistent with post op pain and weakness.  Communication   Communication: No difficulties  Cognition Arousal/Alertness: Awake/alert Behavior During Therapy: WFL for tasks assessed/performed Overall Cognitive  Status: Within Functional Limits for tasks assessed                                        General Comments General comments (skin integrity, edema, etc.): Pt's wife present at beginning of session.     Exercises Total Joint Exercises Ankle Circles/Pumps: AROM;Both;10 reps;Supine   Assessment/Plan    PT Assessment Patient needs continued PT services  PT Problem List Decreased strength;Decreased balance;Decreased range of motion;Decreased mobility;Decreased knowledge of use of DME;Pain       PT Treatment Interventions DME instruction;Gait training;Functional mobility training;Stair training;Therapeutic activities;Therapeutic exercise;Balance training;Patient/family education    PT Goals (Current goals can be found in the Care Plan section)  Acute Rehab PT Goals Patient Stated Goal: to be able to walk better PT Goal Formulation: With patient Time For Goal Achievement: 11/19/18 Potential to Achieve Goals: Good    Frequency 7X/week   Barriers to discharge        Co-evaluation               AM-PAC PT "6 Clicks" Daily Activity  Outcome Measure Difficulty turning over in bed (including adjusting bedclothes, sheets and blankets)?: A Little Difficulty moving from lying on back to sitting on the side of the bed? : Unable Difficulty sitting down on and standing up from a chair with arms (e.g., wheelchair, bedside commode, etc,.)?: Unable Help needed moving to and from a bed to chair (including a wheelchair)?: A Little Help needed walking in hospital room?: A Little Help needed climbing 3-5 steps with a railing? : A Lot 6 Click Score: 13    End of Session Equipment Utilized During Treatment: Gait belt Activity Tolerance: Patient limited by pain Patient left: in chair;with call bell/phone within reach;with family/visitor present Nurse Communication: Mobility status PT Visit Diagnosis: Unsteadiness on feet (R26.81);Muscle weakness (generalized) (M62.81);Other  abnormalities of gait and mobility (R26.89);Pain Pain - Right/Left: (bilateral ) Pain - part of body: Hip    Time: 0981-1914 PT Time Calculation (min) (ACUTE ONLY): 24 min   Charges:   PT Evaluation $PT Eval Low Complexity: 1 Low PT Treatments $Therapeutic Activity: 8-22 mins        Gladys Damme, PT, DPT  Acute Rehabilitation Services  Pager: [phone] Office: [phone]   Lehman Prom 11/05/2018, 5:46 PM

## 2018-11-05 NOTE — H&P (Signed)
PREOPERATIVE H&P  Chief Complaint: bilateral hip osteoarthritis  HPI: Billy Gomez is a 47 y.o. male who presents for surgical treatment of bilateral hip osteoarthritis.  He denies any changes in medical history.  Past Medical History:  Diagnosis Date  . ADHD   . Anxiety   . Asthma   . Hypertension   . Sleep apnea    cpap   Past Surgical History:  Procedure Laterality Date  . EYE SURGERY    . HERNIA REPAIR     x2  . SPINE SURGERY     Social History   Socioeconomic History  . Marital status: Married    Spouse name: Not on file  . Number of children: Not on file  . Years of education: Not on file  . Highest education level: Not on file  Occupational History  . Not on file  Social Needs  . Financial resource strain: Not on file  . Food insecurity:    Worry: Not on file    Inability: Not on file  . Transportation needs:    Medical: Not on file    Non-medical: Not on file  Tobacco Use  . Smoking status: Former Smoker    Types: Cigarettes  . Smokeless tobacco: Current User    Types: Snuff    Last attempt to quit: 04/28/2012  Substance and Sexual Activity  . Alcohol use: Yes    Alcohol/week: 0.0 standard drinks    Comment: weekly  . Drug use: No  . Sexual activity: Yes    Birth control/protection: None  Lifestyle  . Physical activity:    Days per week: Not on file    Minutes per session: Not on file  . Stress: Not on file  Relationships  . Social connections:    Talks on phone: Not on file    Gets together: Not on file    Attends religious service: Not on file    Active member of club or organization: Not on file    Attends meetings of clubs or organizations: Not on file    Relationship status: Not on file  Other Topics Concern  . Not on file  Social History Narrative  . Not on file   Family History  Problem Relation Age of Onset  . Asthma Sister   . Allergies Sister   . Heart disease Maternal Grandfather   . Heart disease Paternal  Grandfather    No Known Allergies Prior to Admission medications   Medication Sig Start Date End Date Taking? Authorizing Provider  acetaminophen (TYLENOL) 500 MG tablet Take 1,000 mg by mouth every 6 (six) hours as needed for moderate pain.   Yes [provider]  albuterol (PROAIR HFA) 108 (90 Base) MCG/ACT inhaler Inhale 2 puffs into the lungs every 6 (six) hours as needed for wheezing or shortness of breath. 09/10/18  Yes Sagardia, Eilleen Kempf, MD  atorvastatin (LIPITOR) 20 MG tablet Take 1 tablet (20 mg total) by mouth daily. 09/10/18  Yes Sagardia, Eilleen Kempf, MD  Fluticasone-Salmeterol (ADVAIR DISKUS) 100-50 MCG/DOSE AEPB Inhale 1 puff into the lungs 2 (two) times daily. Patient taking differently: Inhale 1 puff into the lungs 2 (two) times daily as needed (asthma).  09/10/18  Yes Sagardia, Eilleen Kempf, MD  ibuprofen (ADVIL,MOTRIN) 200 MG tablet Take 800 mg by mouth every 8 (eight) hours as needed (pain).   Yes [provider]  lisinopril-hydrochlorothiazide (PRINZIDE,ZESTORETIC) 20-25 MG tablet Take 1 tablet by mouth daily. 09/10/18  Yes Sagardia, Irving Shows  Jose, MD  Melatonin 5 MG TABS Take 1 tablet by mouth at bedtime as needed (sleep).   Yes [provider]  naproxen sodium (ALEVE) 220 MG tablet Take 440 mg by mouth 2 (two) times daily as needed (pain).   Yes [provider]     Positive ROS: All other systems have been reviewed and were otherwise negative with the exception of those mentioned in the HPI and as above.  Physical Exam: General: Alert, no acute distress Cardiovascular: No pedal edema Respiratory: No cyanosis, no use of accessory musculature GI: abdomen soft Skin: No lesions in the area of chief complaint Neurologic: Sensation intact distally Psychiatric: Patient is competent for consent with normal mood and affect Lymphatic: no lymphedema  MUSCULOSKELETAL: exam stable  Assessment: bilateral hip osteoarthritis  Plan: Plan for  Procedure(s): BILATERAL ANTERIOR TOTAL HIP ARTHROPLASTY  The risks benefits and alternatives were discussed with the patient including but not limited to the risks of nonoperative treatment, versus surgical intervention including infection, bleeding, nerve injury,  blood clots, cardiopulmonary complications, morbidity, mortality, among others, and they were willing to proceed.   Preoperative templating of the joint replacement has been completed, documented, and submitted to the Operating Room personnel in order to optimize intra-operative equipment management.  Glee Arvin, MD   11/05/2018 7:11 AM

## 2018-11-06 ENCOUNTER — Encounter (HOSPITAL_COMMUNITY): Payer: Self-pay | Admitting: Orthopaedic Surgery

## 2018-11-06 LAB — CBC
HEMATOCRIT: 29.4 % — AB (ref 39.0–52.0)
HEMOGLOBIN: 9.6 g/dL — AB (ref 13.0–17.0)
MCH: 31.8 pg (ref 26.0–34.0)
MCHC: 32.7 g/dL (ref 30.0–36.0)
MCV: 97.4 fL (ref 80.0–100.0)
Platelets: 115 10*3/uL — ABNORMAL LOW (ref 150–400)
RBC: 3.02 MIL/uL — ABNORMAL LOW (ref 4.22–5.81)
RDW: 12.5 % (ref 11.5–15.5)
WBC: 8.7 10*3/uL (ref 4.0–10.5)
nRBC: 0 % (ref 0.0–0.2)

## 2018-11-06 LAB — BASIC METABOLIC PANEL
Anion gap: 6 (ref 5–15)
BUN: 20 mg/dL (ref 6–20)
CALCIUM: 7.7 mg/dL — AB (ref 8.9–10.3)
CHLORIDE: 104 mmol/L (ref 98–111)
CO2: 26 mmol/L (ref 22–32)
CREATININE: 1.09 mg/dL (ref 0.61–1.24)
GFR calc Af Amer: >60 mL/min (ref >60)
GFR calc non Af Amer: >60 mL/min (ref >60)
GLUCOSE: 117 mg/dL — AB (ref 70–99)
Potassium: 3.1 mmol/L — ABNORMAL LOW (ref 3.5–5.1)
Sodium: 136 mmol/L (ref 135–145)

## 2018-11-06 MED ORDER — KCL-LACTATED RINGERS 20 MEQ/L IV SOLN
INTRAVENOUS | Status: DC
  Administered 2018-11-06: 13:00:00 via INTRAVENOUS
  Filled 2018-11-06 (×2): qty 1000

## 2018-11-06 NOTE — Care Management Note (Signed)
Case Management Note  Patient Details  Name: Billy Gomez MRN: [id number] Date of Birth: 1971/12/11  Subjective/Objective:      S/p bilateral total hip              Action/Plan: [facility] to pt [facility] from surgeon's office) Pt has RW, 3n1 bedside commode and shower chair at home. Wife at home to assist with care.   Expected Discharge Date:  11/07/18               Expected Discharge Plan:  Home w Home Health Services  In-House Referral:  NA  Discharge planning Services  CM Consult  Post Acute Care Choice:  Home Health Choice offered to:  Patient  DME Arranged:  N/A DME Agency:  NA  HH Arranged:  PT HH Agency:  [facility]  Status of Service:  Completed, signed off  If discussed at [employer] Meetings, dates discussed:    Additional Comments:  Elliot CousinShavis, Zaidee Rion Ellen, RN 11/06/2018, 12:11 PM

## 2018-11-06 NOTE — Progress Notes (Signed)
Subjective: 1 Day Post-Op Procedure(s) (LRB): BILATERAL ANTERIOR TOTAL HIP ARTHROPLASTY (Bilateral) Patient reports pain as mild.  Some nausea yesterday, none today.  No lightheadedness/dizziness, chest pain/pressure/palpitations or sob.   Objective: Vital signs in last 24 hours: Temp:  [97.2 F (36.2 C)-100 F (37.8 C)] 99.7 F (37.6 C) (11/12 0534) Pulse Rate:  [62-75] 75 (11/12 0534) Resp:  [12-20] 16 (11/12 0534) BP: (73-123)/(43-68) 123/60 (11/12 0534) SpO2:  [90 %-100 %] 99 % (11/12 0534)  Intake/Output from previous day: 11/11 0701 - 11/12 0700 In: 1985.9 [I.V.:1632.4; IV Piggyback:353.5] Out: 1100 [Urine:100; Blood:1000] Intake/Output this shift: No intake/output data recorded.  Recent Labs    11/06/18 0217  HGB 9.6*   Recent Labs    11/06/18 0217  WBC 8.7  RBC 3.02*  HCT 29.4*  PLT 115*   Recent Labs    11/06/18 0217  NA 136  K 3.1*  CL 104  CO2 26  BUN 20  CREATININE 1.09  GLUCOSE 117*  CALCIUM 7.7*   No results for input(s): LABPT, INR in the last 72 hours.  Neurologically intact Neurovascular intact Sensation intact distally Intact pulses distally Dorsiflexion/Plantar flexion intact Incision: dressing C/D/I No cellulitis present Compartment soft    Assessment/Plan: 1 Day Post-Op Procedure(s) (LRB): BILATERAL ANTERIOR TOTAL HIP ARTHROPLASTY (Bilateral) Advance diet Up with therapy  WBAT BLE-no precautions ABLA-stable but will continue to monitor and repeat cbc tomorrow am Hypokalemia- will d/c nacl and add LR with kcl    Cristie HemMary L Stanbery 11/06/2018, 8:00 AM

## 2018-11-06 NOTE — Discharge Instructions (Signed)
INSTRUCTIONS AFTER JOINT REPLACEMENT  ° °o Remove items at home which could result in a fall. This includes throw rugs or furniture in walking pathways °o ICE to the affected joint every three hours while awake for 30 minutes at a time, for at least the first 3-5 days, and then as needed for pain and swelling.  Continue to use ice for pain and swelling. You may notice swelling that will progress down to the foot and ankle.  This is normal after surgery.  Elevate your leg when you are not up walking on it.   °o Continue to use the breathing machine you got in the hospital (incentive spirometer) which will help keep your temperature down.  It is common for your temperature to cycle up and down following surgery, especially at night when you are not up moving around and exerting yourself.  The breathing machine keeps your lungs expanded and your temperature down. ° ° °DIET:  As you were doing prior to hospitalization, we recommend a well-balanced diet. ° °DRESSING / WOUND CARE / SHOWERING ° °You may change your surgical dressing 7 days after surgery.  Then change the dressing every day with sterile gauze.  Please use good hand washing techniques before changing the dressing.  Do not use any lotions or creams on the incision until instructed by your surgeon.  You may shower while you have the surgical dressing which is waterproof.  After removal of surgical dressing, you must cover the incision when showering. ° °ACTIVITY ° °o Increase activity slowly as tolerated, but follow the weight bearing instructions below.   °o No driving for 6 weeks or until further direction given by your physician.  You cannot drive while taking narcotics.  °o No lifting or carrying greater than 10 lbs. until further directed by your surgeon. °o Avoid periods of inactivity such as sitting longer than an hour when not asleep. This helps prevent blood clots.  °o You may return to work once you are authorized by your doctor.  ° ° ° °WEIGHT  BEARING  ° °Weight bearing as tolerated with assist device (walker, cane, etc) as directed, use it as long as suggested by your surgeon or therapist, typically at least 4-6 weeks. ° ° °EXERCISES ° °Results after joint replacement surgery are often greatly improved when you follow the exercise, range of motion and muscle strengthening exercises prescribed by your doctor. Safety measures are also important to protect the joint from further injury. Any time any of these exercises cause you to have increased pain or swelling, decrease what you are doing until you are comfortable again and then slowly increase them. If you have problems or questions, call your caregiver or physical therapist for advice.  ° °Rehabilitation is important following a joint replacement. After just a few days of immobilization, the muscles of the leg can become weakened and shrink (atrophy).  These exercises are designed to build up the tone and strength of the thigh and leg muscles and to improve motion. Often times heat used for twenty to thirty minutes before working out will loosen up your tissues and help with improving the range of motion but do not use heat for the first two weeks following surgery (sometimes heat can increase post-operative swelling).  ° °These exercises can be done on a training (exercise) mat, on the floor, on a table or on a bed. Use whatever works the best and is most comfortable for you.    Use music or television while   you are exercising so that the exercises are a pleasant break in your day. This will make your life better with the exercises acting as a break in your routine that you can look forward to.   Perform all exercises about fifteen times, three times per day or as directed.  You should exercise both the operative leg and the other leg as well. ° °Exercises include: °  °• Quad Sets - Tighten up the muscle on the front of the thigh (Quad) and hold for 5-10 seconds.   °• Straight Leg Raises - With your  knee straight (if you were given a brace, keep it on), lift the leg to 60 degrees, hold for 3 seconds, and slowly lower the leg.  Perform this exercise against resistance later as your leg gets stronger.  °• Leg Slides: Lying on your back, slowly slide your foot toward your buttocks, bending your knee up off the floor (only go as far as is comfortable). Then slowly slide your foot back down until your leg is flat on the floor again.  °• Angel Wings: Lying on your back spread your legs to the side as far apart as you can without causing discomfort.  °• Hamstring Strength:  Lying on your back, push your heel against the floor with your leg straight by tightening up the muscles of your buttocks.  Repeat, but this time bend your knee to a comfortable angle, and push your heel against the floor.  You may put a pillow under the heel to make it more comfortable if necessary.  ° °A rehabilitation program following joint replacement surgery can speed recovery and prevent re-injury in the future due to weakened muscles. Contact your doctor or a physical therapist for more information on knee rehabilitation.  ° ° °CONSTIPATION ° °Constipation is defined medically as fewer than three stools per week and severe constipation as less than one stool per week.  Even if you have a regular bowel pattern at home, your normal regimen is likely to be disrupted due to multiple reasons following surgery.  Combination of anesthesia, postoperative narcotics, change in appetite and fluid intake all can affect your bowels.  ° °YOU MUST use at least one of the following options; they are listed in order of increasing strength to get the job done.  They are all available over the counter, and you may need to use some, POSSIBLY even all of these options:   ° °Drink plenty of fluids (prune juice may be helpful) and high fiber foods °Colace 100 mg by mouth twice a day  °Senokot for constipation as directed and as needed Dulcolax (bisacodyl), take  with full glass of water  °Miralax (polyethylene glycol) once or twice a day as needed. ° °If you have tried all these things and are unable to have a bowel movement in the first 3-4 days after surgery call either your surgeon or your primary doctor.   ° °If you experience loose stools or diarrhea, hold the medications until you stool forms back up.  If your symptoms do not get better within 1 week or if they get worse, check with your doctor.  If you experience "the worst abdominal pain ever" or develop nausea or vomiting, please contact the office immediately for further recommendations for treatment. ° ° °ITCHING:  If you experience itching with your medications, try taking only a single pain pill, or even half a pain pill at a time.  You can also use Benadryl over the counter   for itching or also to help with sleep.  ° °TED HOSE STOCKINGS:  Use stockings on both legs until for at least 2 weeks or as directed by physician office. They may be removed at night for sleeping. ° °MEDICATIONS:  See your medication summary on the “After Visit Summary” that nursing will review with you.  You may have some home medications which will be placed on hold until you complete the course of blood thinner medication.  It is important for you to complete the blood thinner medication as prescribed. ° °PRECAUTIONS:  If you experience chest pain or shortness of breath - call 911 immediately for transfer to the hospital emergency department.  ° °If you develop a fever greater that 101 F, purulent drainage from wound, increased redness or drainage from wound, foul odor from the wound/dressing, or calf pain - CONTACT YOUR SURGEON.   °                                                °FOLLOW-UP APPOINTMENTS:  If you do not already have a post-op appointment, please call the office for an appointment to be seen by your surgeon.  Guidelines for how soon to be seen are listed in your “After Visit Summary”, but are typically between 1-4 weeks  after surgery. ° °OTHER INSTRUCTIONS:  ° °Knee Replacement:  Do not place pillow under knee, focus on keeping the knee straight while resting. CPM instructions: 0-90 degrees, 2 hours in the morning, 2 hours in the afternoon, and 2 hours in the evening. Place foam block, curve side up under heel at all times except when in CPM or when walking.  DO NOT modify, tear, cut, or change the foam block in any way. ° °MAKE SURE YOU:  °• Understand these instructions.  °• Get help right away if you are not doing well or get worse.  ° ° °Thank you for letting us be a part of your medical care team.  It is a privilege we respect greatly.  We hope these instructions will help you stay on track for a fast and full recovery!  ° ° ° ° °Information on my medicine - XARELTO® (Rivaroxaban) ° °This medication education was reviewed with me or my healthcare representative as part of my discharge preparation.  The pharmacist that spoke with me during my hospital stay was:  Erika Slaby Stillinger, RPH ° °Why was Xarelto® prescribed for you? °Xarelto® was prescribed for you to reduce the risk of blood clots forming after orthopedic surgery. The medical term for these abnormal blood clots is venous thromboembolism (VTE). ° °What do you need to know about xarelto® ? °Take your Xarelto® ONCE DAILY at the same time every day. °You may take it either with or without food. ° °If you have difficulty swallowing the tablet whole, you may crush it and mix in applesauce just prior to taking your dose. ° °Take Xarelto® exactly as prescribed by your doctor and DO NOT stop taking Xarelto® without talking to the doctor who prescribed the medication.  Stopping without other VTE prevention medication to take the place of Xarelto® may increase your risk of developing a clot. ° °After discharge, you should have regular check-up appointments with your healthcare provider that is prescribing your Xarelto®.   ° °What do you do if you miss a dose? °If you    miss a dose, take it as soon as you remember on the same day then continue your regularly scheduled once daily regimen the next day. Do not take two doses of Xarelto® on the same day.  ° °Important Safety Information °A possible side effect of Xarelto® is bleeding. You should call your healthcare provider right away if you experience any of the following: °? Bleeding from an injury or your nose that does not stop. °? Unusual colored urine (red or dark brown) or unusual colored stools (red or black). °? Unusual bruising for unknown reasons. °? A serious fall or if you hit your head (even if there is no bleeding). ° °Some medicines may interact with Xarelto® and might increase your risk of bleeding while on Xarelto®. To help avoid this, consult your healthcare provider or pharmacist prior to using any new prescription or non-prescription medications, including herbals, vitamins, non-steroidal anti-inflammatory drugs (NSAIDs) and supplements. ° °This website has more information on Xarelto®: www.xarelto.com. ° ° ° ° ° ° ° °

## 2018-11-06 NOTE — Evaluation (Signed)
Occupational Therapy Evaluation Patient Details Name: Billy Gomez MRN: [id number] DOB: 18-Oct-1971 Today's Date: 11/06/2018    History of Present Illness Pt is a 47 y/o male s/p bilateral THA's, direct anterior approach. PMH includes asthma and HTN.    Clinical Impression   PTA Pt independent in ADL and mobility. Works full time as a Administrator, drives, Social research officer, government. Pt is currently mod A for LB ADL and min guard for transfers requiring increased time and vc for safety with hand placement. Pt also anxious about transitions at this time. Pt was able to perform sink level grooming at min guard today and practice with our AE demo kit to maximize safety and independence in LB ADL. OT will follow acutely to maximize safety and indpendence in ADL and functional transfers. Next session to provide AE kit as well practice tub transfer with bench.     Follow Up Recommendations  No OT follow up;Supervision/Assistance - 24 hour(initially)    Equipment Recommendations  Other (comment)(AE "hip" kit)    Recommendations for Other Services       Precautions / Restrictions Precautions Precautions: Fall Restrictions Weight Bearing Restrictions: Yes RLE Weight Bearing: Weight bearing as tolerated LLE Weight Bearing: Weight bearing as tolerated      Mobility Bed Mobility Overal bed mobility: Needs Assistance Bed Mobility: Supine to Sit     Supine to sit: Min guard     General bed mobility comments: Pt OOB initially and chose to go back to the recliner at end of session  Transfers Overall transfer level: Needs assistance Equipment used: Rolling walker (2 wheeled) Transfers: Sit to/from Stand Sit to Stand: From elevated surface;Min guard         General transfer comment: increased time required; no physical assist needed; anxious with transitions    Balance Overall balance assessment: Needs assistance Sitting-balance support: No upper extremity supported;Feet supported Sitting  balance-Leahy Scale: Good     Standing balance support: Bilateral upper extremity supported;During functional activity Standing balance-Leahy Scale: Fair Standing balance comment: able to maintain static standing for short periods at sink                           ADL either performed or assessed with clinical judgement   ADL Overall ADL's : Needs assistance/impaired Eating/Feeding: Modified independent;Sitting   Grooming: Wash/dry hands;Wash/dry face;Oral care;Min guard;Standing Grooming Details (indicate cue type and reason): sink level Upper Body Bathing: Supervision/ safety;Sitting                             General ADL Comments: educated in AE kit for LB ADL (grabber/reacher, long handle sponge, long handle shoe horn, sock donner)     Vision Baseline Vision/History: Wears glasses Wears Glasses: At all times Patient Visual Report: No change from baseline       Perception     Praxis      Pertinent Vitals/Pain Pain Assessment: 0-10 Pain Score: 2  Faces Pain Scale: Hurts a little bit Pain Location: bilat hips/thighs Pain Descriptors / Indicators: Operative site guarding;Sore;Burning;Tightness Pain Intervention(s): Monitored during session;Repositioned     Hand Dominance Left   Extremity/Trunk Assessment Upper Extremity Assessment Upper Extremity Assessment: Overall WFL for tasks assessed       Cervical / Trunk Assessment Cervical / Trunk Assessment: Normal   Communication Communication Communication: No difficulties(very talkative)   Cognition Arousal/Alertness: Awake/alert Behavior During Therapy: WFL for tasks assessed/performed Overall  Cognitive Status: Within Functional Limits for tasks assessed                                     General Comments       Exercises Exercises: Total Joint Total Joint Exercises Ankle Circles/Pumps: AROM;Both;10 reps Quad Sets: Strengthening;Both;10 reps Short Arc Quad:  Strengthening;Both;10 reps Heel Slides: AAROM;Strengthening;Both;10 reps Hip ABduction/ADduction: AAROM;Strengthening;Both;10 reps Long Arc Quad: Strengthening;Both;10 reps;Seated   Shoulder Instructions      Home Living Family/patient expects to be discharged to:: Private residence Living Arrangements: Spouse/significant other(recent diagnosis of ALS - limited in ability to help) Available Help at Discharge: Family;Available 24 hours/day Type of Home: House Home Access: Stairs to enter [employer] of Steps: 2 Entrance Stairs-Rails: Right;Left;Can reach both Home Layout: One level     Bathroom Shower/Tub: Teacher, early years/pre: Standard     Home Equipment: Environmental consultant - 2 wheels;Bedside commode;Tub bench;Adaptive equipment Adaptive Equipment: Reacher        Prior Functioning/Environment Level of Independence: Independent        Comments: truck driver, typically active        OT Problem List: Decreased activity tolerance;Impaired balance (sitting and/or standing);Decreased knowledge of use of DME or AE;Pain      OT Treatment/Interventions: Self-care/ADL training;DME and/or AE instruction;Therapeutic activities;Patient/family education;Balance training    OT Goals(Current goals can be found in the care plan section) Acute Rehab OT Goals Patient Stated Goal: to be able to walk better so I can take care of my wife OT Goal Formulation: With patient Time For Goal Achievement: 11/20/18 Potential to Achieve Goals: Good ADL Goals Pt Will Perform Grooming: with modified independence;standing Pt Will Perform Lower Body Bathing: with modified independence;with adaptive equipment;sitting/lateral leans Pt Will Perform Lower Body Dressing: with modified independence;with adaptive equipment;sit to/from stand Pt Will Transfer to Toilet: with modified independence;ambulating Pt Will Perform Toileting - Clothing Manipulation and hygiene: with modified  independence;sit to/from stand Pt Will Perform Tub/Shower Transfer: Tub transfer;with supervision;tub bench Additional ADL Goal #1: Pt will perform bed mobility at mod I level prior to engaging in ADL activity  OT Frequency: Min 3X/week   Barriers to D/C:            Co-evaluation              AM-PAC PT "6 Clicks" Daily Activity     Outcome Measure Help from another person eating meals?: None Help from another person taking care of personal grooming?: A Little Help from another person toileting, which includes using toliet, bedpan, or urinal?: A Little Help from another person bathing (including washing, rinsing, drying)?: A Lot Help from another person to put on and taking off regular upper body clothing?: None Help from another person to put on and taking off regular lower body clothing?: A Lot 6 Click Score: 18   End of Session Equipment Utilized During Treatment: Rolling walker;Gait belt Nurse Communication: Mobility status;Weight bearing status;Other (comment)(Pt asking about medications)  Activity Tolerance: Patient tolerated treatment well Patient left: in chair;with call bell/phone within reach  OT Visit Diagnosis: Unsteadiness on feet (R26.81);Other abnormalities of gait and mobility (R26.89);Pain Pain - Right/Left: (bilateral) Pain - part of body: Hip                Time: 6761-9509 OT Time Calculation (min): 38 min Charges:  OT General Charges $OT Visit: 1 Visit OT Evaluation $OT Eval Moderate Complexity:  1 Mod OT Treatments $Self Care/Home Management : 23-37 mins  Hulda Humphrey OTR/L Acute Rehabilitation Services Pager: [phone] Office: [place] 11/06/2018, 11:19 AM

## 2018-11-06 NOTE — Progress Notes (Signed)
Physical Therapy Treatment Patient Details Name: Billy Gomez MRN: [id number] DOB: 08-Jan-1971 Today's Date: 11/06/2018    History of Present Illness Pt is a 47 y/o male s/p bilateral THA's, direct anterior approach. PMH includes asthma and HTN.     PT Comments    Patient seen for mobility progression and is progressing well. Pt tolerated gait training and bilat LE therex and reports decreased pain with mobility. Continue to progress as tolerated.    Follow Up Recommendations  Follow surgeon's recommendation for DC plan and follow-up therapies;Supervision for mobility/OOB     Equipment Recommendations  None recommended by PT    Recommendations for Other Services       Precautions / Restrictions Precautions Precautions: Fall Restrictions Weight Bearing Restrictions: Yes RLE Weight Bearing: Weight bearing as tolerated LLE Weight Bearing: Weight bearing as tolerated    Mobility  Bed Mobility Overal bed mobility: Needs Assistance Bed Mobility: Supine to Sit     Supine to sit: Min guard     General bed mobility comments: min guard for safety; HOB flat and no use of rails  Transfers Overall transfer level: Needs assistance Equipment used: Rolling walker (2 wheeled) Transfers: Sit to/from Stand Sit to Stand: From elevated surface;Min guard         General transfer comment: cues for safe hand placement; no physical assist needed  Ambulation/Gait Ambulation/Gait assistance: Min guard Gait Distance (Feet): 150 Feet Assistive device: Rolling walker (2 wheeled) Gait Pattern/deviations: Step-through pattern;Decreased stride length Gait velocity: Decreased    General Gait Details: decreased cadence; cues for posture and proximity to RW; pt with improving step length symmetry with distance   Stairs             Wheelchair Mobility    Modified Rankin (Stroke Patients Only)       Balance Overall balance assessment: Needs assistance Sitting-balance  support: No upper extremity supported;Feet supported Sitting balance-Leahy Scale: Good     Standing balance support: Bilateral upper extremity supported;During functional activity Standing balance-Leahy Scale: Poor Standing balance comment: Reliant on BUE support.                             Cognition Arousal/Alertness: Awake/alert Behavior During Therapy: WFL for tasks assessed/performed Overall Cognitive Status: Within Functional Limits for tasks assessed                                        Exercises Total Joint Exercises Ankle Circles/Pumps: AROM;Both;10 reps Quad Sets: Strengthening;Both;10 reps Short Arc Quad: Strengthening;Both;10 reps Heel Slides: AAROM;Strengthening;Both;10 reps Hip ABduction/ADduction: AAROM;Strengthening;Both;10 reps Long Arc Quad: Strengthening;Both;10 reps;Seated    General Comments        Pertinent Vitals/Pain Pain Assessment: Faces Faces Pain Scale: Hurts a little bit Pain Location: bilat hips/thighs Pain Descriptors / Indicators: Operative site guarding;Sore Pain Intervention(s): Limited activity within patient's tolerance;Monitored during session;Repositioned;Premedicated before session    Home Living                      Prior Function            PT Goals (current goals can now be found in the care plan section) Acute Rehab PT Goals Patient Stated Goal: to be able to walk better Progress towards PT goals: Progressing toward goals    Frequency    7X/week  PT Plan Current plan remains appropriate    Co-evaluation              AM-PAC PT "6 Clicks" Daily Activity  Outcome Measure  Difficulty turning over in bed (including adjusting bedclothes, sheets and blankets)?: A Little Difficulty moving from lying on back to sitting on the side of the bed? : Unable Difficulty sitting down on and standing up from a chair with arms (e.g., wheelchair, bedside commode, etc,.)?: Unable Help  needed moving to and from a bed to chair (including a wheelchair)?: A Little Help needed walking in hospital room?: A Little Help needed climbing 3-5 steps with a railing? : A Lot 6 Click Score: 13    End of Session Equipment Utilized During Treatment: Gait belt Activity Tolerance: Patient tolerated treatment well Patient left: in chair;with call bell/phone within reach Nurse Communication: Mobility status PT Visit Diagnosis: Unsteadiness on feet (R26.81);Muscle weakness (generalized) (M62.81);Other abnormalities of gait and mobility (R26.89);Pain Pain - Right/Left: (bilateral ) Pain - part of body: Hip     Time: 1610-9604 PT Time Calculation (min) (ACUTE ONLY): 32 min  Charges:  $Gait Training: 8-22 mins $Therapeutic Exercise: 8-22 mins                     Billy Gomez, PTA Acute Rehabilitation Services Pager: [phone] Office: [phone]     Carolynne Edouard 11/06/2018, 9:40 AM

## 2018-11-06 NOTE — Progress Notes (Signed)
Physical Therapy Treatment Patient Details Name: Billy Gomez MRN: [id number] DOB: 1971-12-08 Today's Date: 11/06/2018    History of Present Illness Pt is a 47 y/o male s/p bilateral THA's, direct anterior approach. PMH includes asthma and HTN.     PT Comments    Patient continues to make progress toward PT goals and tolerated session well without c/o increased pain. Pt is able to ascend/descend stairs simulating home entrance with min guard/min A for safety. Continue to progress as tolerated.    Follow Up Recommendations  Follow surgeon's recommendation for DC plan and follow-up therapies;Supervision for mobility/OOB     Equipment Recommendations  None recommended by PT    Recommendations for Other Services       Precautions / Restrictions Precautions Precautions: Fall Restrictions Weight Bearing Restrictions: Yes RLE Weight Bearing: Weight bearing as tolerated LLE Weight Bearing: Weight bearing as tolerated    Mobility  Bed Mobility               General bed mobility comments: pt OOB in chair upon arrival   Transfers Overall transfer level: Needs assistance Equipment used: Rolling walker (2 wheeled) Transfers: Sit to/from Stand Sit to Stand: Min guard         General transfer comment: cues for safety   Ambulation/Gait Ambulation/Gait assistance: Min guard Gait Distance (Feet): 200 Feet Assistive device: Rolling walker (2 wheeled) Gait Pattern/deviations: Step-through pattern;Decreased stride length;Trunk flexed Gait velocity: Decreased    General Gait Details: cues for posture; good step through pattern    Stairs Stairs: Yes Stairs assistance: Min guard;Min assist Stair Management: Two rails;Forwards;One rail Left;Step to pattern;Sideways Number of Stairs: (2 steps X 2 trials) General stair comments: cues for safety and techniques; pt is able to ascend/descend steps with bilat rails and single rail going sideways    Wheelchair Mobility     Modified Rankin (Stroke Patients Only)       Balance Overall balance assessment: Needs assistance Sitting-balance support: No upper extremity supported;Feet supported Sitting balance-Leahy Scale: Good     Standing balance support: Bilateral upper extremity supported;During functional activity Standing balance-Leahy Scale: Poor Standing balance comment: pt is able to static stand without UE support                            Cognition Arousal/Alertness: Awake/alert Behavior During Therapy: WFL for tasks assessed/performed Overall Cognitive Status: Within Functional Limits for tasks assessed                                        Exercises      General Comments General comments (skin integrity, edema, etc.): wife present in room      Pertinent Vitals/Pain Pain Assessment: Faces Pain Score: 2  Faces Pain Scale: Hurts a little bit Pain Location: bilat hips/thighs Pain Descriptors / Indicators: Operative site guarding;Discomfort Pain Intervention(s): Limited activity within patient's tolerance;Monitored during session;Repositioned    Home Living Family/patient expects to be discharged to:: Private residence Living Arrangements: Spouse/significant other(recent diagnosis of ALS - limited in ability to help) Available Help at Discharge: Family;Available 24 hours/day Type of Home: House Home Access: Stairs to enter Entrance Stairs-Rails: Right;Left;Can reach both Home Layout: One level Home Equipment: Environmental consultantWalker - 2 wheels;Bedside commode;Tub bench;Adaptive equipment      Prior Function Level of Independence: Independent      Comments: truck driver, typically  active   PT Goals (current goals can now be found in the care plan section) Acute Rehab PT Goals Patient Stated Goal: to be able to walk better Progress towards PT goals: Progressing toward goals    Frequency    7X/week      PT Plan Current plan remains appropriate     Co-evaluation              AM-PAC PT "6 Clicks" Daily Activity  Outcome Measure  Difficulty turning over in bed (including adjusting bedclothes, sheets and blankets)?: A Little Difficulty moving from lying on back to sitting on the side of the bed? : Unable Difficulty sitting down on and standing up from a chair with arms (e.g., wheelchair, bedside commode, etc,.)?: Unable Help needed moving to and from a bed to chair (including a wheelchair)?: A Little Help needed walking in hospital room?: A Little Help needed climbing 3-5 steps with a railing? : A Little 6 Click Score: 14    End of Session Equipment Utilized During Treatment: Gait belt Activity Tolerance: Patient tolerated treatment well Patient left: in chair;with call bell/phone within reach;with family/visitor present Nurse Communication: Mobility status PT Visit Diagnosis: Unsteadiness on feet (R26.81);Muscle weakness (generalized) (M62.81);Other abnormalities of gait and mobility (R26.89);Pain Pain - Right/Left: (bilateral ) Pain - part of body: Hip     Time: 1332-1400 PT Time Calculation (min) (ACUTE ONLY): 28 min  Charges:  $Gait Training: 23-37 mins                     Erline Levine, PTA Acute Rehabilitation Services Pager: [phone] Office: [phone]     Carolynne Edouard 11/06/2018, 2:15 PM

## 2018-11-06 NOTE — Progress Notes (Signed)
Patient refused CPAP at this time. RT advised to call if he changes his mind.

## 2018-11-07 LAB — BASIC METABOLIC PANEL
ANION GAP: 4 — AB (ref 5–15)
BUN: 12 mg/dL (ref 6–20)
CALCIUM: 8.3 mg/dL — AB (ref 8.9–10.3)
CO2: 26 mmol/L (ref 22–32)
Chloride: 107 mmol/L (ref 98–111)
Creatinine, Ser: 0.81 mg/dL (ref 0.61–1.24)
GFR calc non Af Amer: >60 mL/min (ref >60)
Glucose, Bld: 125 mg/dL — ABNORMAL HIGH (ref 70–99)
Potassium: 3.5 mmol/L (ref 3.5–5.1)
Sodium: 137 mmol/L (ref 135–145)

## 2018-11-07 LAB — CBC
HEMATOCRIT: 26.9 % — AB (ref 39.0–52.0)
HEMOGLOBIN: 9.1 g/dL — AB (ref 13.0–17.0)
MCH: 32.5 pg (ref 26.0–34.0)
MCHC: 33.8 g/dL (ref 30.0–36.0)
MCV: 96.1 fL (ref 80.0–100.0)
NRBC: 0 % (ref 0.0–0.2)
Platelets: 122 10*3/uL — ABNORMAL LOW (ref 150–400)
RBC: 2.8 MIL/uL — ABNORMAL LOW (ref 4.22–5.81)
RDW: 12.3 % (ref 11.5–15.5)
WBC: 11 10*3/uL — ABNORMAL HIGH (ref 4.0–10.5)

## 2018-11-07 NOTE — Progress Notes (Signed)
Occupational Therapy Treatment - Late Entry Patient Details Name: Billy Gomez MRN: [id number] DOB: 1971/01/27 Today's Date: 11/07/2018    History of present illness Pt is a 47 y/o male s/p bilateral THA's, direct anterior approach. PMH includes asthma and HTN.    OT comments  Pt seen earlier in day (late note entry) for AE kit (provided by OT) and practice for dressing. Pt able to demonstrate set up level dressing skills with assist from AE. Wife also present throughout session for education. Pt has been such a pleasure to work with, thank you for the opportunity to serve this patient.    Follow Up Recommendations  Follow surgeon's recommendation for DC plan and follow-up therapies    Equipment Recommendations  None recommended by OT(Pt provided with AE kit)    Recommendations for Other Services      Precautions / Restrictions Precautions Precautions: Fall Restrictions Weight Bearing Restrictions: Yes RLE Weight Bearing: Weight bearing as tolerated LLE Weight Bearing: Weight bearing as tolerated       Mobility Bed Mobility               General bed mobility comments: pt OOB in chair upon arrival   Transfers Overall transfer level: Needs assistance Equipment used: Rolling walker (2 wheeled) Transfers: Sit to/from Stand Sit to Stand: Min guard         General transfer comment: min guard for safety    Balance Overall balance assessment: Needs assistance Sitting-balance support: No upper extremity supported;Feet supported Sitting balance-Leahy Scale: Good     Standing balance support: Bilateral upper extremity supported;During functional activity Standing balance-Leahy Scale: Poor Standing balance comment: pt is able to static stand without UE support                           ADL either performed or assessed with clinical judgement   ADL Overall ADL's : Needs assistance/impaired                 Upper Body Dressing : Set  up;Sitting Upper Body Dressing Details (indicate cue type and reason): ablet don shirt and jacket Lower Body Dressing: Set up;With adaptive equipment;Sit to/from stand Lower Body Dressing Details (indicate cue type and reason): able to utilize AE for LB dressing (provided by therapist) able to use grabber/reacher, sock donner, and long handle shoe horn to perform Toilet Transfer: Min guard;Ambulation;RW           Functional mobility during ADLs: Min guard;Rolling walker General ADL Comments: wife present throughout session     Vision       Perception     Praxis      Cognition Arousal/Alertness: Awake/alert Behavior During Therapy: WFL for tasks assessed/performed Overall Cognitive Status: Within Functional Limits for tasks assessed                                          Exercises     Shoulder Instructions       General Comments wife present throughout session    Pertinent Vitals/ Pain       Pain Assessment: Faces Faces Pain Scale: Hurts little more Pain Location: bilat hips/thighs Pain Descriptors / Indicators: Discomfort;Grimacing;Sore Pain Intervention(s): Monitored during session;Repositioned  Home Living  Prior Functioning/Environment              Frequency  Min 3X/week        Progress Toward Goals  OT Goals(current goals can now be found in the care plan section)  Progress towards OT goals: Progressing toward goals  Acute Rehab OT Goals Patient Stated Goal: to be able to walk better OT Goal Formulation: With patient Time For Goal Achievement: 11/20/18 Potential to Achieve Goals: Good  Plan Discharge plan remains appropriate;Frequency remains appropriate    Co-evaluation                 AM-PAC PT "6 Clicks" Daily Activity     Outcome Measure   Help from another person eating meals?: None Help from another person taking care of personal grooming?: A  Little Help from another person toileting, which includes using toliet, bedpan, or urinal?: A Little Help from another person bathing (including washing, rinsing, drying)?: A Lot Help from another person to put on and taking off regular upper body clothing?: None Help from another person to put on and taking off regular lower body clothing?: A Lot 6 Click Score: 18    End of Session Equipment Utilized During Treatment: Rolling walker  OT Visit Diagnosis: Unsteadiness on feet (R26.81);Other abnormalities of gait and mobility (R26.89);Pain Pain - Right/Left: (bilateral) Pain - part of body: Hip   Activity Tolerance Patient tolerated treatment well   Patient Left in chair;with call bell/phone within reach;with family/visitor present   Nurse Communication Mobility status;Weight bearing status;Other (comment)(dressed)        Time: 4591-3685 OT Time Calculation (min): 20 min  Charges: OT General Charges $OT Visit: 1 Visit OT Treatments $Self Care/Home Management : 8-22 mins Hulda Humphrey OTR/L Acute Rehabilitation Services Pager: [phone] Office: [place] 11/07/2018, 3:21 PM

## 2018-11-07 NOTE — Progress Notes (Signed)
Discharge instructions given to the patient, written and verbal.  The patient and his wife verbalizes understanding those instructions.  The patient will be discharged via wheel chair with follow up appointments in place.

## 2018-11-07 NOTE — Progress Notes (Signed)
Physical Therapy Treatment Patient Details Name: Billy Gomez MRN: [id number] DOB: [birth year] Today's Date: 11/07/2018    History of Present Illness Pt is a 47 y/o male s/p bilateral THA's, direct anterior approach. PMH includes asthma and HTN.     PT Comments    Patient is making good progress with PT.  From a mobility standpoint anticipate patient will be ready for DC home when medically ready.    Follow Up Recommendations  Follow surgeon's recommendation for DC plan and follow-up therapies;Supervision for mobility/OOB     Equipment Recommendations  None recommended by PT    Recommendations for Other Services       Precautions / Restrictions Precautions Precautions: Fall Restrictions Weight Bearing Restrictions: Yes RLE Weight Bearing: Weight bearing as tolerated LLE Weight Bearing: Weight bearing as tolerated    Mobility  Bed Mobility               General bed mobility comments: pt OOB in chair upon arrival   Transfers Overall transfer level: Needs assistance Equipment used: Rolling walker (2 wheeled) Transfers: Sit to/from Stand Sit to Stand: Min guard         General transfer comment: min guard for safety  Ambulation/Gait Ambulation/Gait assistance: Min guard Gait Distance (Feet): 170 Feet Assistive device: Rolling walker (2 wheeled) Gait Pattern/deviations: Step-through pattern;Decreased stride length Gait velocity: Decreased    General Gait Details: improved hip extension and upright posture; cues for increased stride length    Stairs             Wheelchair Mobility    Modified Rankin (Stroke Patients Only)       Balance Overall balance assessment: Needs assistance Sitting-balance support: No upper extremity supported;Feet supported Sitting balance-Leahy Scale: Good     Standing balance support: Bilateral upper extremity supported;During functional activity Standing balance-Leahy Scale: Poor Standing balance comment: pt  is able to static stand without UE support                            Cognition Arousal/Alertness: Awake/alert Behavior During Therapy: WFL for tasks assessed/performed Overall Cognitive Status: Within Functional Limits for tasks assessed                                        Exercises Total Joint Exercises Short Arc Quad: Strengthening;Both;15 reps Knee Flexion: Strengthening;Both;10 reps;Standing Marching in Standing: Strengthening;Both;10 reps;Standing    General Comments General comments (skin integrity, edema, etc.): wife present      Pertinent Vitals/Pain Pain Assessment: Faces Faces Pain Scale: Hurts little more Pain Location: bilat hips/thighs Pain Descriptors / Indicators: Discomfort;Grimacing;Sore Pain Intervention(s): Limited activity within patient's tolerance;Monitored during session;Premedicated before session;Repositioned    Home Living                      Prior Function            PT Goals (current goals can now be found in the care plan section) Progress towards PT goals: Progressing toward goals    Frequency    7X/week      PT Plan Current plan remains appropriate    Co-evaluation              AM-PAC PT "6 Clicks" Daily Activity  Outcome Measure  Difficulty turning over in bed (including adjusting bedclothes, sheets and blankets)?: A Little  Difficulty moving from lying on back to sitting on the side of the bed? : Unable Difficulty sitting down on and standing up from a chair with arms (e.g., wheelchair, bedside commode, etc,.)?: Unable Help needed moving to and from a bed to chair (including a wheelchair)?: A Little Help needed walking in hospital room?: A Little Help needed climbing 3-5 steps with a railing? : A Little 6 Click Score: 14    End of Session Equipment Utilized During Treatment: Gait belt Activity Tolerance: Patient tolerated treatment well Patient left: in chair;with call  bell/phone within reach;with family/visitor present Nurse Communication: Mobility status PT Visit Diagnosis: Unsteadiness on feet (R26.81);Muscle weakness (generalized) (M62.81);Other abnormalities of gait and mobility (R26.89);Pain Pain - Right/Left: (bilateral ) Pain - part of body: Hip     Time: 1191-4782 PT Time Calculation (min) (ACUTE ONLY): 25 min  Charges:  $Gait Training: 8-22 mins $Therapeutic Exercise: 8-22 mins                     Erline Levine, PTA Acute Rehabilitation Services Pager: [phone] Office: [phone]     Carolynne Edouard 11/07/2018, 9:09 AM

## 2018-11-07 NOTE — Plan of Care (Signed)
  Problem: Education: Goal: Knowledge of General Education information will improve Description Including pain rating scale, medication(s)/side effects and non-pharmacologic comfort measures Outcome: Progressing Note:  POC and pain management reviewed with pt.   

## 2018-11-07 NOTE — Progress Notes (Signed)
Subjective: 2 Days Post-Op Procedure(s) (LRB): BILATERAL ANTERIOR TOTAL HIP ARTHROPLASTY (Bilateral) Patient reports pain as moderate.  Doing well this am.  Objective: Vital signs in last 24 hours: Temp:  [98.4 F (36.9 C)-100.1 F (37.8 C)] 98.4 F (36.9 C) (11/13 0459) Pulse Rate:  [72-100] 72 (11/13 0459) Resp:  [17-20] 20 (11/13 0459) BP: (117-131)/(63-75) 131/70 (11/13 0459) SpO2:  [97 %-100 %] 100 % (11/13 0459)  Intake/Output from previous day: 11/12 0701 - 11/13 0700 In: 480 [P.O.:480] Out: 600 [Urine:600] Intake/Output this shift: No intake/output data recorded.  Recent Labs    11/06/18 0217 11/07/18 0235  HGB 9.6* 9.1*   Recent Labs    11/06/18 0217 11/07/18 0235  WBC 8.7 11.0*  RBC 3.02* 2.80*  HCT 29.4* 26.9*  PLT 115* 122*   Recent Labs    11/06/18 0217 11/07/18 0235  NA 136 137  K 3.1* 3.5  CL 104 107  CO2 26 26  BUN 20 12  CREATININE 1.09 0.81  GLUCOSE 117* 125*  CALCIUM 7.7* 8.3*   No results for input(s): LABPT, INR in the last 72 hours.  Neurologically intact Neurovascular intact Sensation intact distally Intact pulses distally Dorsiflexion/Plantar flexion intact Incision: scant drainage No cellulitis present Compartment soft   Assessment/Plan: 2 Days Post-Op Procedure(s) (LRB): BILATERAL ANTERIOR TOTAL HIP ARTHROPLASTY (Bilateral) Advance diet Up with therapy D/C IV fluids Discharge home with home health after first or second PT session (up to patient) WBAT BLE-no precautions ABLA-stable and asymptomatic    Cristie HemMary L Stanbery 11/07/2018, 8:12 AM

## 2018-11-07 NOTE — Discharge Summary (Signed)
Patient ID: Billy Gomez MRN: [id number] DOB/AGE: 49-Aug-1972 [age] y.o.  Admit date: 11/05/2018 Discharge date: 11/07/2018  Admission Diagnoses:  Active Problems:   Primary osteoarthritis of left hip   Primary osteoarthritis of right hip   Bilateral primary osteoarthritis of hip   Discharge Diagnoses:  Same  Past Medical History:  Diagnosis Date  . ADHD   . Anxiety   . Asthma   . Hypertension   . Sleep apnea    cpap    Surgeries: Procedure(s): BILATERAL ANTERIOR TOTAL HIP ARTHROPLASTY on 11/05/2018   Consultants:   Discharged Condition: Improved  Hospital Course: Billy Grosugene E Thornell is an 47 y.o. male who was admitted 11/05/2018 for operative treatment of end stage degenerative joint disease bilateral hips . Patient has severe unremitting pain that affects sleep, daily activities, and work/hobbies. After pre-op clearance the patient was taken to the operating room on 11/05/2018 and underwent  Procedure(s): BILATERAL ANTERIOR TOTAL HIP ARTHROPLASTY.    Patient was given perioperative antibiotics:  Anti-infectives (From admission, onward)   Start     Dose/Rate Route Frequency Ordered Stop   11/05/18 2200  sulfamethoxazole-trimethoprim (BACTRIM DS,SEPTRA DS) 800-160 MG per tablet 1 tablet     1 tablet Oral Every 12 hours 11/05/18 1458     11/05/18 1530  ceFAZolin (ANCEF) IVPB 2g/100 mL premix     2 g 200 mL/hr over 30 Minutes Intravenous Every 6 hours 11/05/18 1458 11/06/18 0553   11/05/18 0825  vancomycin (VANCOCIN) powder  Status:  Discontinued       As needed 11/05/18 0826 11/05/18 1150   11/05/18 0615  ceFAZolin (ANCEF) IVPB 2g/100 mL premix     2 g 200 mL/hr over 30 Minutes Intravenous On call to O.R. 11/05/18 0606 11/05/18 0754   11/05/18 0000  sulfamethoxazole-trimethoprim (BACTRIM DS,SEPTRA DS) 800-160 MG tablet     1 tablet Oral 2 times daily 11/05/18 1124         Patient was given sequential compression devices, early ambulation, and chemoprophylaxis to  prevent DVT.  Patient benefited maximally from hospital stay and there were no complications.    Recent vital signs:  Patient Vitals for the past 24 hrs:  BP Temp Temp src Pulse Resp SpO2  11/07/18 0459 131/70 98.4 F (36.9 C) Oral 72 20 100 %  11/06/18 2034 129/66 99 F (37.2 C) Oral 100 20 98 %  11/06/18 1139 128/75 100.1 F (37.8 C) Oral 95 20 97 %  11/06/18 0900 117/63 99.3 F (37.4 C) Oral 74 17 98 %     Recent laboratory studies:  Recent Labs    11/06/18 0217 11/07/18 0235  WBC 8.7 11.0*  HGB 9.6* 9.1*  HCT 29.4* 26.9*  PLT 115* 122*  NA 136 137  K 3.1* 3.5  CL 104 107  CO2 26 26  BUN 20 12  CREATININE 1.09 0.81  GLUCOSE 117* 125*  CALCIUM 7.7* 8.3*     Discharge Medications:   Allergies as of 11/07/2018   No Known Allergies     Medication List    STOP taking these medications   acetaminophen 500 MG tablet Commonly known as:  TYLENOL   ibuprofen 200 MG tablet Commonly known as:  ADVIL,MOTRIN   naproxen sodium 220 MG tablet Commonly known as:  ALEVE     TAKE these medications   albuterol 108 (90 Base) MCG/ACT inhaler Commonly known as:  PROVENTIL HFA;VENTOLIN HFA Inhale 2 puffs into the lungs every 6 (six) hours as needed for wheezing or  shortness of breath.   atorvastatin 20 MG tablet Commonly known as:  LIPITOR Take 1 tablet (20 mg total) by mouth daily.   Fluticasone-Salmeterol 100-50 MCG/DOSE Aepb Commonly known as:  ADVAIR Inhale 1 puff into the lungs 2 (two) times daily. What changed:    when to take this  reasons to take this   lisinopril-hydrochlorothiazide 20-25 MG tablet Commonly known as:  PRINZIDE,ZESTORETIC Take 1 tablet by mouth daily.   Melatonin 5 MG Tabs Take 1 tablet by mouth at bedtime as needed (sleep).   methocarbamol 750 MG tablet Commonly known as:  ROBAXIN Take 1 tablet (750 mg total) by mouth 2 (two) times daily as needed for muscle spasms.   ondansetron 4 MG tablet Commonly known as:  ZOFRAN Take 1-2  tablets (4-8 mg total) by mouth every 8 (eight) hours as needed for nausea or vomiting.   oxyCODONE 10 mg 12 hr tablet Commonly known as:  OXYCONTIN Take 1 tablet (10 mg total) by mouth every 12 (twelve) hours for 5 days.   oxyCODONE 5 MG immediate release tablet Commonly known as:  Oxy IR/ROXICODONE Take 1-3 tablets (5-15 mg total) by mouth every 4 (four) hours as needed.   promethazine 25 MG tablet Commonly known as:  PHENERGAN Take 1 tablet (25 mg total) by mouth every 6 (six) hours as needed for nausea.   rivaroxaban 10 MG Tabs tablet Commonly known as:  XARELTO Take 1 tablet (10 mg total) by mouth daily.   sulfamethoxazole-trimethoprim 800-160 MG tablet Commonly known as:  BACTRIM DS,SEPTRA DS Take 1 tablet by mouth 2 (two) times daily.            Durable Medical Equipment  (From admission, onward)         Start     Ordered   11/05/18 1459  DME Walker rolling  Once    Question:  Patient needs a walker to treat with the following condition  Answer:  History of hip replacement   11/05/18 1458   11/05/18 1459  DME 3 n 1  Once     11/05/18 1458   11/05/18 1459  DME Bedside commode  Once    Question:  Patient needs a bedside commode to treat with the following condition  Answer:  History of hip replacement   11/05/18 1458          Diagnostic Studies: Dg Chest 2 View  Result Date: 10/29/2018 CLINICAL DATA:  Preoperative respiratory exam prior to bilateral hip replacements. EXAM: CHEST - 2 VIEW COMPARISON:  01/27/2015 FINDINGS: The cardiomediastinal silhouette is unremarkable. There is no evidence of focal airspace disease, pulmonary edema, suspicious pulmonary nodule/mass, pleural effusion, or pneumothorax. No acute bony abnormalities are identified. IMPRESSION: No active cardiopulmonary disease. Electronically Signed   By: Harmon Pier M.D.   On: 10/29/2018 13:18   Dg Pelvis Portable  Result Date: 11/05/2018 CLINICAL DATA:  Status post hip arthroplasty. EXAM:  PORTABLE PELVIS 1-2 VIEWS COMPARISON:  Fluoroscopic images of same day. FINDINGS: Status post bilateral total hip arthroplasties. The femoral and acetabular components bilaterally appear to be well situated. IMPRESSION: Status post bilateral total hip arthroplasties. Electronically Signed   By: Lupita Raider, M.D.   On: 11/05/2018 15:09   Dg C-arm 1-60 Min  Result Date: 11/05/2018 CLINICAL DATA:  47 year old male post right total hip replacement. Subsequent encounter. EXAM: DG C-ARM 61-120 MIN; OPERATIVE RIGHT HIP WITH PELVIS Fluoroscopic time: 42 seconds. COMPARISON:  09/10/2018. FINDINGS: Three intraoperative C-arm views of the right hip  submitted for review after surgery. Right total hip prosthesis appears in satisfactory position on frontal projection without complication noted. Prior left hip replacement. IMPRESSION: Post total right hip replacement. Electronically Signed   By: Lacy Duverney M.D.   On: 11/05/2018 11:32   Dg C-arm 1-60 Min  Result Date: 11/05/2018 CLINICAL DATA:  Left total hip arthroplasty EXAM: DG C-ARM 61-120 MIN; OPERATIVE LEFT HIP WITH PELVIS COMPARISON:  09/10/2018 FINDINGS: Changes of left hip replacement. Normal AP alignment. No hardware bony complicating feature. IMPRESSION: Left hip replacement.  No visible complicating feature. Electronically Signed   By: Charlett Nose M.D.   On: 11/05/2018 09:49   Dg Hip Operative Unilat With Pelvis Left  Result Date: 11/05/2018 CLINICAL DATA:  Left total hip arthroplasty EXAM: DG C-ARM 61-120 MIN; OPERATIVE LEFT HIP WITH PELVIS COMPARISON:  09/10/2018 FINDINGS: Changes of left hip replacement. Normal AP alignment. No hardware bony complicating feature. IMPRESSION: Left hip replacement.  No visible complicating feature. Electronically Signed   By: Charlett Nose M.D.   On: 11/05/2018 09:49   Dg Hip Operative Unilat With Pelvis Right  Result Date: 11/05/2018 CLINICAL DATA:  47 year old male post right total hip replacement.  Subsequent encounter. EXAM: DG C-ARM 61-120 MIN; OPERATIVE RIGHT HIP WITH PELVIS Fluoroscopic time: 42 seconds. COMPARISON:  09/10/2018. FINDINGS: Three intraoperative C-arm views of the right hip submitted for review after surgery. Right total hip prosthesis appears in satisfactory position on frontal projection without complication noted. Prior left hip replacement. IMPRESSION: Post total right hip replacement. Electronically Signed   By: Lacy Duverney M.D.   On: 11/05/2018 11:32    Disposition: Discharge disposition: 01-Home or Self Care         Follow-up Information    Tarry Kos, MD In 2 weeks.   Specialty:  Orthopedic Surgery Why:  For suture removal, For wound re-check Contact information: [address] [place] [address] [phone]        [facility] Follow up.   Specialty:  Home Health Services Why:  Home Health Physical Therapy-agency will call to arrange initial visit Contact information: [address] [place] [address] [phone]            Signed: Cristie Hem 11/07/2018, 7:58 AM

## 2018-11-12 ENCOUNTER — Telehealth (INDEPENDENT_AMBULATORY_CARE_PROVIDER_SITE_OTHER): Payer: Self-pay | Admitting: Orthopaedic Surgery

## 2018-11-12 NOTE — Telephone Encounter (Signed)
[facility]le [facility] for the following:  2x a week for 4 weeks for standard rehab for the hip.  Also, she wanted to know if he can take Tylenol for break through pain and when she was reviewing his medication, she received a severe interaction between Bactrim and Losartan.  CB#[phone].  Thank you.

## 2018-11-12 NOTE — Telephone Encounter (Signed)
Please advise on all parts of msg

## 2018-11-12 NOTE — Telephone Encounter (Signed)
Yes to HHPT Yes she can take tylenol for break through pain. We can stop bactrim and do keflex 500 mg QID x 10 days

## 2018-11-13 NOTE — Telephone Encounter (Signed)
Approved the HHPT to [place].

## 2018-11-13 NOTE — Telephone Encounter (Signed)
Called patient no answer. LMOM to return call to advise on message below.  

## 2018-11-13 NOTE — Telephone Encounter (Signed)
Therapist

## 2018-11-14 ENCOUNTER — Telehealth (INDEPENDENT_AMBULATORY_CARE_PROVIDER_SITE_OTHER): Payer: Self-pay

## 2018-11-14 ENCOUNTER — Other Ambulatory Visit (INDEPENDENT_AMBULATORY_CARE_PROVIDER_SITE_OTHER): Payer: Self-pay

## 2018-11-14 MED ORDER — CEPHALEXIN 500 MG PO CAPS: ORAL_CAPSULE | ORAL | 0 refills | Status: AC

## 2018-11-14 NOTE — Telephone Encounter (Signed)
CALLED MICHELLE TO ADVISE. ALSO CALLED IN RX FOR KEFLEX INTO PHARM. MICHELLE WILL LET PATIENT KNOW.

## 2018-11-14 NOTE — Telephone Encounter (Signed)
Refill #[id number]

## 2018-11-14 NOTE — Telephone Encounter (Signed)
See message.

## 2018-11-14 NOTE — Telephone Encounter (Signed)
Patient states Marcelino DusterMichelle, P.T., with [facility], is suggesting he take the Oxycodone more regularly while he is doing therapy.  He hasn't been taking as often as prescribed.  He has approx. 20-25 pills left.  He states he will need some more before his post op visit next week.  [phone]

## 2018-11-15 ENCOUNTER — Other Ambulatory Visit (INDEPENDENT_AMBULATORY_CARE_PROVIDER_SITE_OTHER): Payer: Self-pay

## 2018-11-15 MED ORDER — OXYCODONE HCL 5 MG PO TABS: 5.0000 mg | ORAL_TABLET | ORAL | 0 refills | Status: AC | PRN

## 2018-11-15 NOTE — Telephone Encounter (Signed)
I guess it was sent in electronically.  I guess just apologize and let him know that I don't have access to e prescribe opiate meds.

## 2018-11-15 NOTE — Telephone Encounter (Signed)
What was the previous Rx?

## 2018-11-15 NOTE — Telephone Encounter (Signed)
oxycodone

## 2018-11-15 NOTE — Telephone Encounter (Signed)
Rx ready for pick up. 

## 2018-11-15 NOTE — Telephone Encounter (Signed)
RX PRINTED

## 2018-11-15 NOTE — Telephone Encounter (Signed)
Called patient to let him know Rx ready for pick up here in our office. States he cannot pick up RX and would like for this to be sent into his pharm. I advised him I cannot send it to the pharm this Rx would have to be picked up. States previous Rx was called into pharm.  Patient is very upset..   CB [phone]

## 2018-11-16 NOTE — Telephone Encounter (Signed)
I have done this. I explained to him and is upset bc we cannot send RX into pharm and is unable to pick up. Please call.

## 2018-11-18 IMAGING — DX DG HIP (WITH OR WITHOUT PELVIS) 2-3V*L*
3 series · 3 of 3 positions shown · non-contrast
Comparison: None.

CLINICAL DATA: Left-sided hip pain, no known injury, initial
encounter

EXAM:
DG HIP (WITH OR WITHOUT PELVIS) 3V LEFT

[pelvis ap]
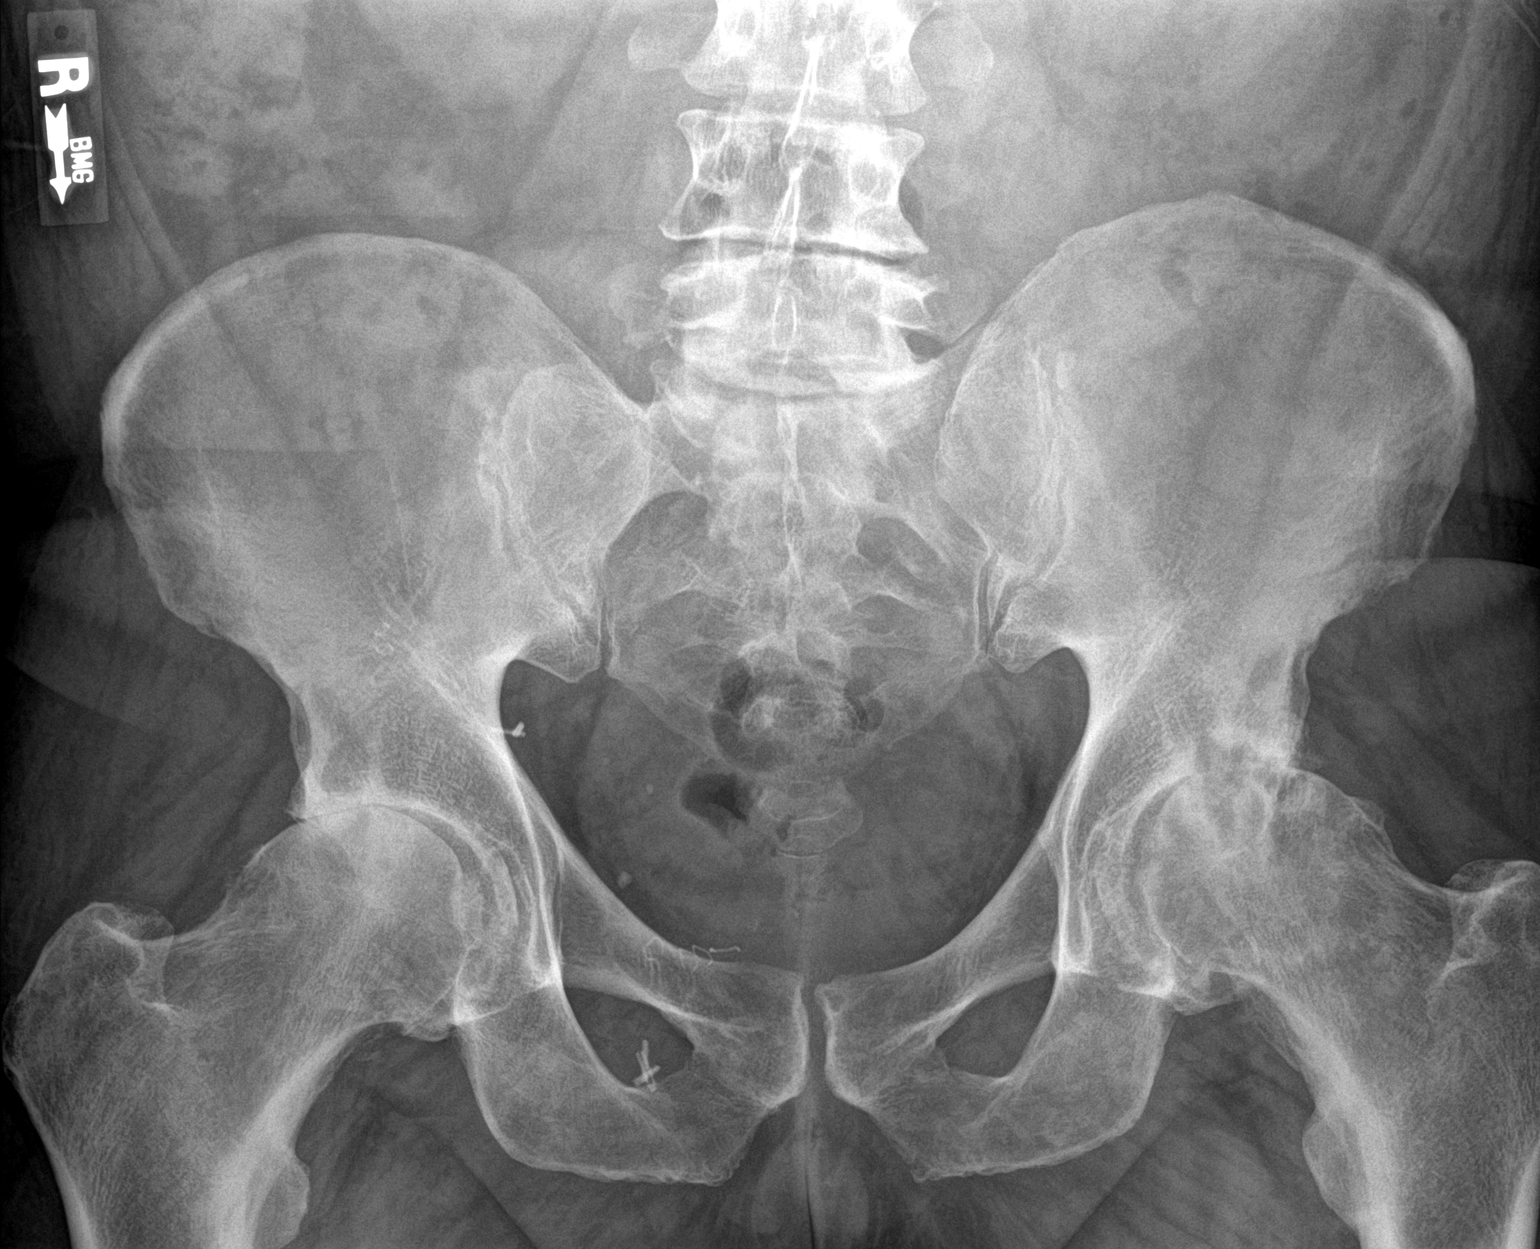

[hip ap]
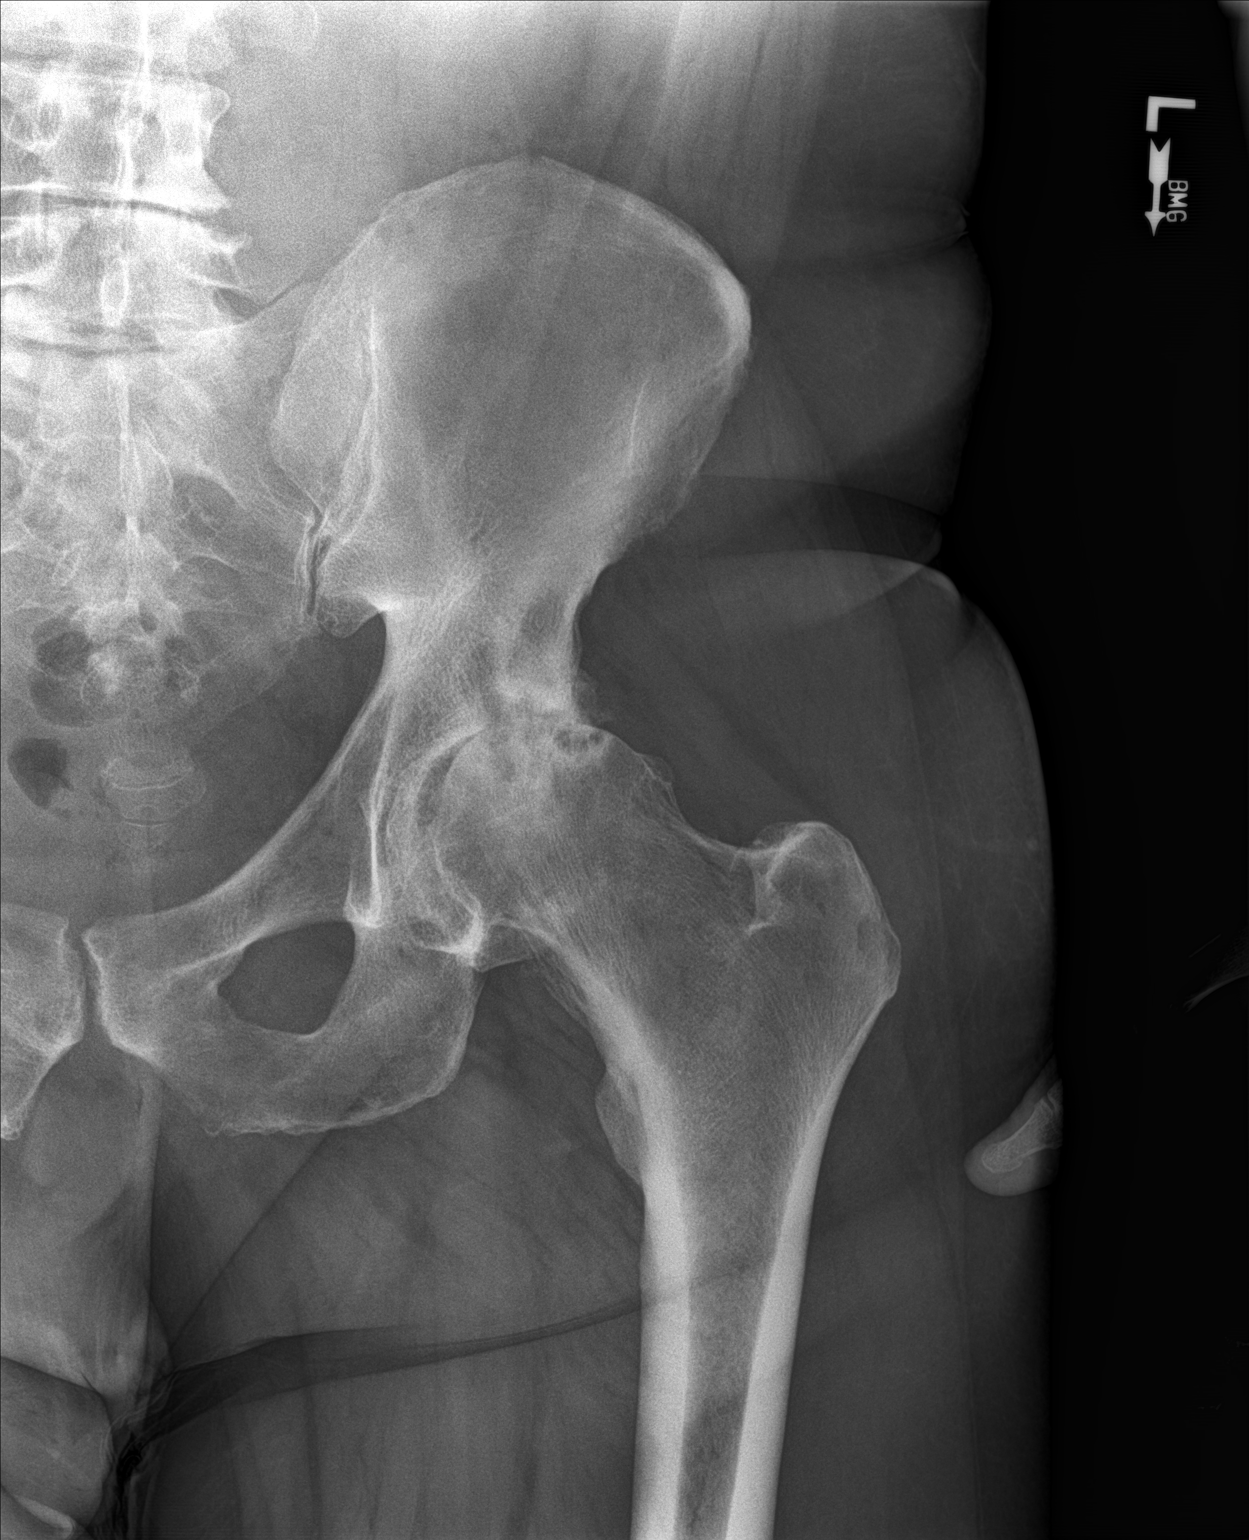

[hip lat]
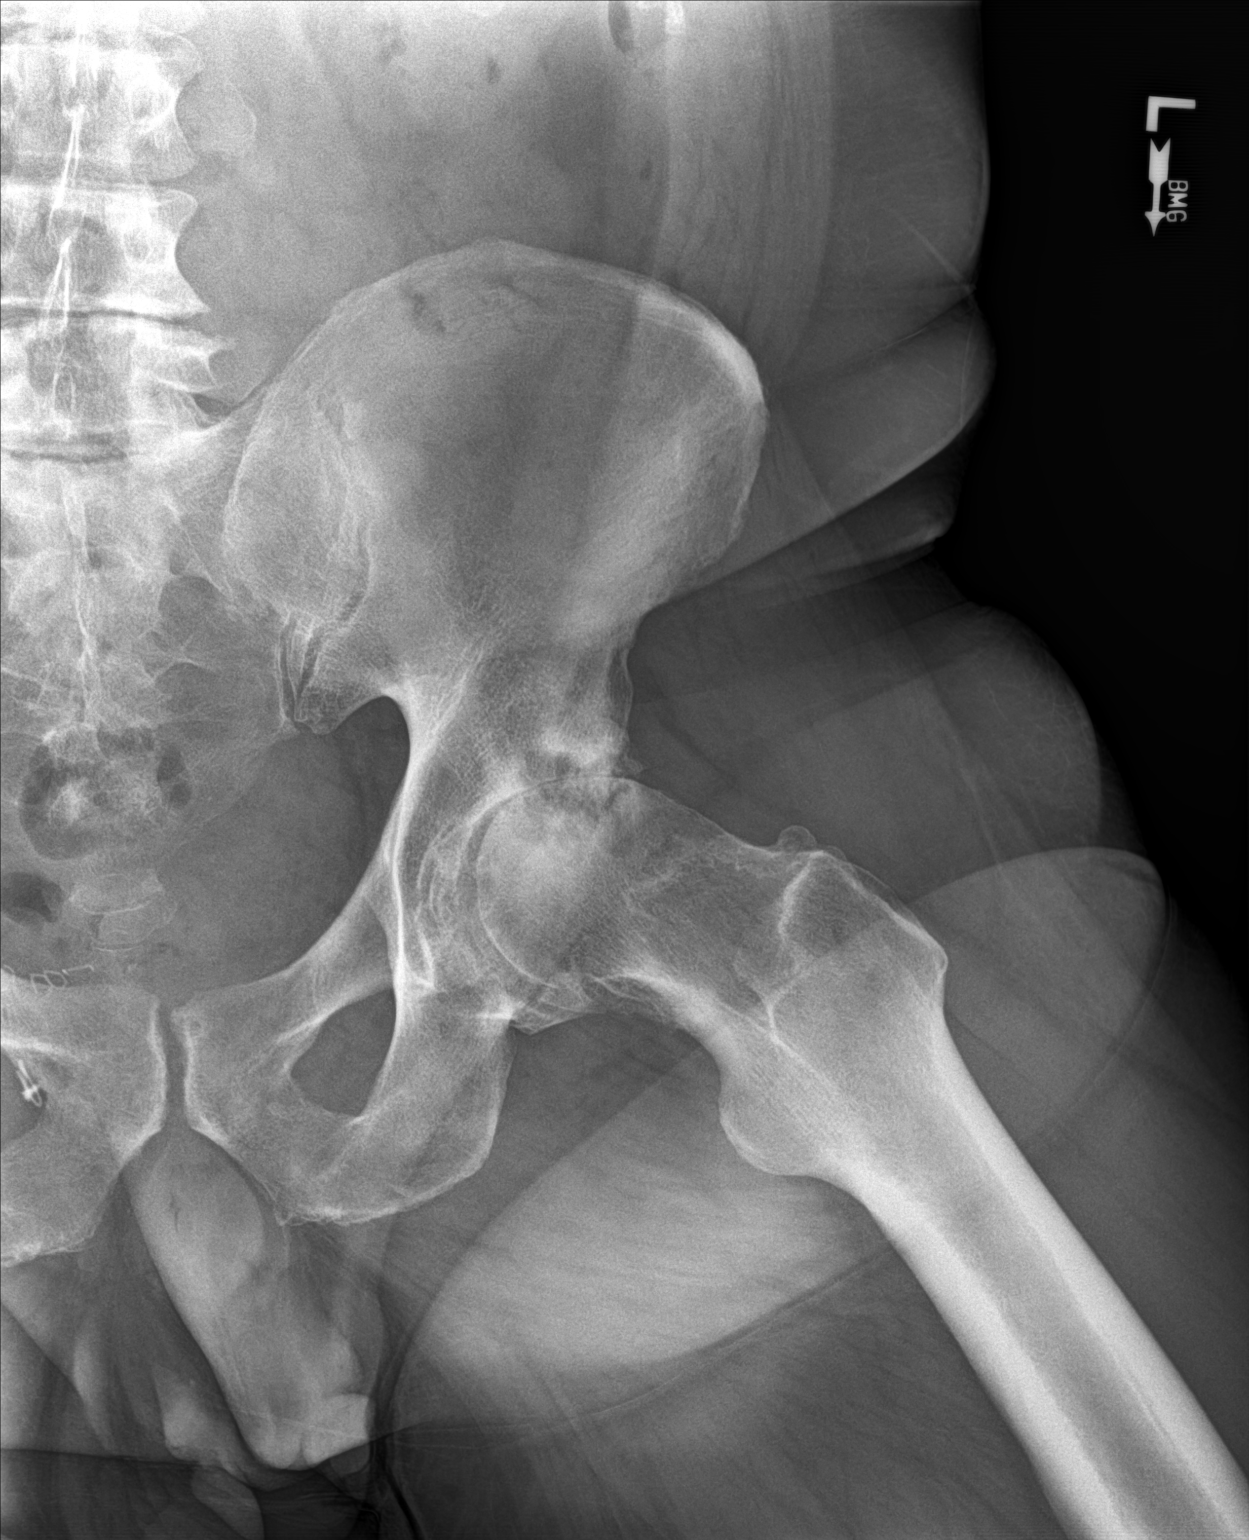

[3 of 3 positions shown; findings below may reference images not displayed]

FINDINGS: Pelvic ring is intact. Postsurgical changes are seen. Degenerative
changes of the hip joints are noted bilaterally worse on the left
than the right with subchondral sclerosis and cyst formation. Loss
of the superior joint space is noted. Mild degenerative changes of
the lumbar spine are seen. No acute fracture is noted
IMPRESSION: Significant degenerative changes left greater than right. No acute
fracture is seen.

## 2018-11-20 ENCOUNTER — Encounter (INDEPENDENT_AMBULATORY_CARE_PROVIDER_SITE_OTHER): Payer: Self-pay | Admitting: Orthopaedic Surgery

## 2018-11-20 ENCOUNTER — Ambulatory Visit (INDEPENDENT_AMBULATORY_CARE_PROVIDER_SITE_OTHER): Payer: BLUE CROSS/BLUE SHIELD | Admitting: Orthopaedic Surgery

## 2018-11-20 DIAGNOSIS — Z96643 Presence of artificial hip joint, bilateral: Secondary | ICD-10-CM | POA: Insufficient documentation

## 2018-11-20 NOTE — Progress Notes (Signed)
   Post-Op Visit Note   Patient: Billy Gomez           Date of Birth: [birth year]           MRN: [id number] Visit Date: 11/20/2018 PCP: Shade FloodGreene, Jeffrey R, MD   Assessment & Plan:  Chief Complaint:  Chief Complaint  Patient presents with  . Right Hip - Pain, Routine Post Op  . Left Hip - Pain, Routine Post Op   Visit Diagnoses:  1. Status post bilateral hip replacements     Plan: Billy Gomez is 2-week status post bilateral total hip replacements.  He is overall doing well.  He is progressing with home physical therapy.  His incisions are both clean dry and intact.  There is no drainage.  He does have postoperative swelling in the thigh.  He has noticed in a considerable improvement in his hip pain.  Today we pain in the incisions with Betadine and applied Steri-Strips.  He may shower at this point.  We will discontinue Xarelto and begin aspirin twice a day.  Continue with home physical therapy.  I would like to recheck him in 1 month with standing AP pelvis and bilateral lateral hips.  Oxycodone was refilled today.  Follow-Up Instructions: Return in about 4 weeks (around 12/18/2018).   Orders:  No orders of the defined types were placed in this encounter.  No orders of the defined types were placed in this encounter.   Imaging: No results found.  PMFS History: Patient Active Problem List   Diagnosis Date Noted  . Status post bilateral hip replacements 11/20/2018  . Primary osteoarthritis of right hip 11/05/2018  . Bilateral primary osteoarthritis of hip 11/05/2018  . Primary osteoarthritis of left hip 09/10/2018  . Left hip pain 09/10/2018  . OSA on CPAP 10/18/2016  . Essential hypertension 08/30/2014  . Mixed hyperlipidemia 08/30/2014  . RAD (reactive airway disease), mild persistent, uncomplicated 08/30/2014  . BMI 35.0-35.9,adult 08/30/2014   Past Medical History:  Diagnosis Date  . ADHD   . Anxiety   . Asthma   . Hypertension   . Sleep apnea    cpap      Family History  Problem Relation Age of Onset  . Asthma Sister   . Allergies Sister   . Heart disease Maternal Grandfather   . Heart disease Paternal Grandfather     Past Surgical History:  Procedure Laterality Date  . BILATERAL ANTERIOR TOTAL HIP ARTHROPLASTY Bilateral 11/05/2018   Procedure: BILATERAL ANTERIOR TOTAL HIP ARTHROPLASTY;  Surgeon: Tarry KosXu,  M, MD;  Location: [facility] OR;  Service: Orthopedics;  Laterality: Bilateral;  . EYE SURGERY    . HERNIA REPAIR     x2  . SPINE SURGERY     Social History   Occupational History  . Not on file  Tobacco Use  . Smoking status: Former Smoker    Types: Cigarettes  . Smokeless tobacco: Current User    Types: Snuff    Last attempt to quit: 04/28/2012  Substance and Sexual Activity  . Alcohol use: Yes    Alcohol/week: 0.0 standard drinks    Comment: weekly  . Drug use: No  . Sexual activity: Yes    Birth control/protection: None

## 2018-11-30 ENCOUNTER — Telehealth (INDEPENDENT_AMBULATORY_CARE_PROVIDER_SITE_OTHER): Payer: Self-pay

## 2018-11-30 MED ORDER — MUPIROCIN 2 % EX OINT: 1.0000 "application " | TOPICAL_OINTMENT | Freq: Two times a day (BID) | CUTANEOUS | 0 refills | Status: AC

## 2018-11-30 NOTE — Addendum Note (Signed)
Addended by: Mayra ReelXU, N MICHAEL on: 11/30/2018 05:07 PM   Modules accepted: Orders

## 2018-11-30 NOTE — Telephone Encounter (Signed)
Roetta SessionsFYI-  Michelle, PT with [facility] called stating that patient was using a heating pad on both of his incisions and because of the numbness, patient has 1st degree burn and redness on left hip incision, and a blister had formed on the the distal and lateral of right hip incision.  Stated that blister had opened and she advised patient to keep clean and cover with a gauze.  Cb# is [phone].  Thank You.

## 2018-12-10 ENCOUNTER — Telehealth (INDEPENDENT_AMBULATORY_CARE_PROVIDER_SITE_OTHER): Payer: Self-pay | Admitting: Orthopaedic Surgery

## 2018-12-10 NOTE — Telephone Encounter (Signed)
Ok thanks 

## 2018-12-10 NOTE — Telephone Encounter (Signed)
Michelle-(PT) with [facility] called left voicemail message stating patient was discharged from home therapy today. Patient met all goals and is doing well. BP was elevated and she directed patient to consult his PCP. The number to contact Michelle is [phone] °

## 2018-12-14 ENCOUNTER — Encounter (INDEPENDENT_AMBULATORY_CARE_PROVIDER_SITE_OTHER): Payer: Self-pay | Admitting: Orthopaedic Surgery

## 2018-12-14 ENCOUNTER — Ambulatory Visit (INDEPENDENT_AMBULATORY_CARE_PROVIDER_SITE_OTHER): Payer: BLUE CROSS/BLUE SHIELD

## 2018-12-14 ENCOUNTER — Ambulatory Visit (INDEPENDENT_AMBULATORY_CARE_PROVIDER_SITE_OTHER): Payer: Self-pay

## 2018-12-14 ENCOUNTER — Ambulatory Visit (INDEPENDENT_AMBULATORY_CARE_PROVIDER_SITE_OTHER): Payer: BLUE CROSS/BLUE SHIELD | Admitting: Orthopaedic Surgery

## 2018-12-14 VITALS — Ht 71.5 in | Wt 251.2 lb

## 2018-12-14 DIAGNOSIS — Z96643 Presence of artificial hip joint, bilateral: Secondary | ICD-10-CM | POA: Diagnosis not present

## 2018-12-14 NOTE — Progress Notes (Signed)
   Post-Op Visit Note   Patient: Billy Gomez           Date of Birth: Jan 17, 1971           MRN: [id number] Visit Date: 12/14/2018 I will recheck him in 6 weeks PCP: Shade FloodGreene, Jeffrey R, MD   Assessment & Plan:  Chief Complaint:  Chief Complaint  Patient presents with  . Right Hip - Routine Post Op  . Left Hip - Routine Post Op   Visit Diagnoses:  1. Status post bilateral hip replacements     Plan: [name] is a 47 year old gentleman who is nearly 6 weeks status post bilateral total hip replacements.  He comes in today for his 6-week follow-up.  He is doing well has no concerns.  He continues to improve in his activity and strength.  His surgical scars are fully healed.  Leg lengths are equal.  His x-rays demonstrate stable bilateral total hip replacements.  At this point I would like him to continue with strengthening for another couple weeks.  He may return back to work on January 2 without restrictions.  Follow-Up Instructions: Return in about 6 weeks (around 01/25/2019).   Orders:  Orders Placed This Encounter  Procedures  . XR HIP UNILAT W OR W/O PELVIS 2-3 VIEWS LEFT  . XR HIP UNILAT W OR W/O PELVIS 2-3 VIEWS RIGHT   No orders of the defined types were placed in this encounter.   Imaging: Xr Hip Unilat W Or W/o Pelvis 2-3 Views Left  Result Date: 12/14/2018 Stable bilateral total hip replacements in good alignment  Xr Hip Unilat W Or W/o Pelvis 2-3 Views Right  Result Date: 12/14/2018 Stable bilateral total hip replacements in good alignment.   PMFS History: Patient Active Problem List   Diagnosis Date Noted  . Status post bilateral hip replacements 11/20/2018  . Primary osteoarthritis of right hip 11/05/2018  . Bilateral primary osteoarthritis of hip 11/05/2018  . Primary osteoarthritis of left hip 09/10/2018  . Left hip pain 09/10/2018  . OSA on CPAP 10/18/2016  . Essential hypertension 08/30/2014  . Mixed hyperlipidemia 08/30/2014  . RAD (reactive  airway disease), mild persistent, uncomplicated 08/30/2014  . BMI 35.0-35.9,adult 08/30/2014   Past Medical History:  Diagnosis Date  . ADHD   . Anxiety   . Asthma   . Hypertension   . Sleep apnea    cpap    Family History  Problem Relation Age of Onset  . Asthma Sister   . Allergies Sister   . Heart disease Maternal Grandfather   . Heart disease Paternal Grandfather     Past Surgical History:  Procedure Laterality Date  . BILATERAL ANTERIOR TOTAL HIP ARTHROPLASTY Bilateral 11/05/2018   Procedure: BILATERAL ANTERIOR TOTAL HIP ARTHROPLASTY;  Surgeon: Tarry KosXu, Naiping M, MD;  Location: [facility] OR;  Service: Orthopedics;  Laterality: Bilateral;  . EYE SURGERY    . HERNIA REPAIR     x2  . SPINE SURGERY     Social History   Occupational History  . Not on file  Tobacco Use  . Smoking status: Former Smoker    Types: Cigarettes  . Smokeless tobacco: Current User    Types: Snuff    Last attempt to quit: 04/28/2012  Substance and Sexual Activity  . Alcohol use: Yes    Alcohol/week: 0.0 standard drinks    Comment: weekly  . Drug use: No  . Sexual activity: Yes    Birth control/protection: None

## 2018-12-17 ENCOUNTER — Encounter (INDEPENDENT_AMBULATORY_CARE_PROVIDER_SITE_OTHER): Payer: Self-pay | Admitting: Radiology

## 2018-12-17 NOTE — Progress Notes (Unsigned)
Marisue IvanLiz please see patient message in chart, patient needs an implant card and a form filled out.  Please contact patient Thursday to arrange.  Thanks.

## 2018-12-20 NOTE — Progress Notes (Signed)
See message.

## 2019-01-25 ENCOUNTER — Encounter (INDEPENDENT_AMBULATORY_CARE_PROVIDER_SITE_OTHER): Payer: Self-pay | Admitting: Orthopaedic Surgery

## 2019-01-25 ENCOUNTER — Ambulatory Visit (INDEPENDENT_AMBULATORY_CARE_PROVIDER_SITE_OTHER): Payer: BLUE CROSS/BLUE SHIELD | Admitting: Physician Assistant

## 2019-01-25 VITALS — Ht 71.0 in | Wt 251.0 lb

## 2019-01-25 DIAGNOSIS — Z96643 Presence of artificial hip joint, bilateral: Secondary | ICD-10-CM

## 2019-01-25 DIAGNOSIS — M160 Bilateral primary osteoarthritis of hip: Secondary | ICD-10-CM

## 2019-01-25 NOTE — Progress Notes (Signed)
   Post-Op Visit Note   Patient: Billy Gomez           Date of Birth: Apr 03, 1971           MRN: [id number] Visit Date: 01/25/2019 PCP: Shade Flood, MD   Assessment & Plan:  Chief Complaint:  Chief Complaint  Patient presents with  . Right Hip - Follow-up    11/05/18 bilat total hip replacements   . Left Hip - Follow-up   Visit Diagnoses:  1. History of total hip replacement, bilateral     Plan: Patient is a pleasant 48 year old male who presents to our clinic today 81 days status post bilateral anterior total hip replacements, date of surgery 11/05/2018.  He has been doing excellent.  He is regained full motion and strength.  He has no pain.  No fevers or chills.  Examination of both hips reveals full motion and strength.  Calfs are soft and nontender.  He is neurovascular intact distally.  This point, he will continue to advance with activity as tolerated.  Follow-up with [place] in 3 months time for repeat evaluation and x-rays.  Dental prophylaxis was reinforced today  Follow-Up Instructions: Return in about 3 months (around 04/25/2019).   Orders:  No orders of the defined types were placed in this encounter.  No orders of the defined types were placed in this encounter.   Imaging: No new imaging  PMFS History: Patient Active Problem List   Diagnosis Date Noted  . History of total hip replacement, bilateral 11/20/2018  . Primary osteoarthritis of right hip 11/05/2018  . Bilateral primary osteoarthritis of hip 11/05/2018  . Primary osteoarthritis of left hip 09/10/2018  . Left hip pain 09/10/2018  . OSA on CPAP 10/18/2016  . Essential hypertension 08/30/2014  . Mixed hyperlipidemia 08/30/2014  . RAD (reactive airway disease), mild persistent, uncomplicated 08/30/2014  . BMI 35.0-35.9,adult 08/30/2014   Past Medical History:  Diagnosis Date  . ADHD   . Anxiety   . Asthma   . Hypertension   . Sleep apnea    cpap    Family History  Problem Relation Age  of Onset  . Asthma Sister   . Allergies Sister   . Heart disease Maternal Grandfather   . Heart disease Paternal Grandfather     Past Surgical History:  Procedure Laterality Date  . BILATERAL ANTERIOR TOTAL HIP ARTHROPLASTY Bilateral 11/05/2018   Procedure: BILATERAL ANTERIOR TOTAL HIP ARTHROPLASTY;  Surgeon: Tarry Kos, MD;  Location: [facility] OR;  Service: Orthopedics;  Laterality: Bilateral;  . EYE SURGERY    . HERNIA REPAIR     x2  . SPINE SURGERY     Social History   Occupational History  . Not on file  Tobacco Use  . Smoking status: Former Smoker    Types: Cigarettes  . Smokeless tobacco: Current User    Types: Snuff    Last attempt to quit: 04/28/2012  Substance and Sexual Activity  . Alcohol use: Yes    Alcohol/week: 0.0 standard drinks    Comment: weekly  . Drug use: No  . Sexual activity: Yes    Birth control/protection: None

## 2019-03-02 ENCOUNTER — Other Ambulatory Visit: Payer: Self-pay | Admitting: Emergency Medicine

## 2019-03-02 DIAGNOSIS — J4530 Mild persistent asthma, uncomplicated: Secondary | ICD-10-CM

## 2019-03-04 NOTE — Telephone Encounter (Signed)
Requested Prescriptions  Pending Prescriptions Disp Refills  . PROAIR HFA 108 (90 Base) MCG/ACT inhaler [Pharmacy Med Name: ProAir HFA 108 (90 Base) MCG/ACT Inhalation Aerosol Solution] 9 g 0    Sig: INHALE 2 PUFFS BY MOUTH EVERY 6 HOURS AS NEEDED FOR WHEEZING FOR SHORTNESS OF BREATH     Pulmonology:  Beta Agonists Failed - 03/02/2019  1:04 PM      Failed - One inhaler should last at least one month. If the patient is requesting refills earlier, contact the patient to check for uncontrolled symptoms.      Passed - Valid encounter within last 12 months    Recent Outpatient Visits          5 months ago Left hip pain   [facility], Eilleen Kempf, MD   1 year ago Hypernatremia   [facility], Levell July, MD   1 year ago Sebaceous cyst   [facility] at [facility], Gerald Stabs, PA-C   1 year ago Essential hypertension   [facility], Asencion Partridge, MD   1 year ago Essential hypertension   [facility], Asencion Partridge, MD      Future Appointments            In 1 month Tarry Kos, MD [facility]

## 2019-04-25 ENCOUNTER — Ambulatory Visit (INDEPENDENT_AMBULATORY_CARE_PROVIDER_SITE_OTHER): Payer: BLUE CROSS/BLUE SHIELD | Admitting: Orthopaedic Surgery

## 2019-04-25 ENCOUNTER — Encounter (INDEPENDENT_AMBULATORY_CARE_PROVIDER_SITE_OTHER): Payer: Self-pay | Admitting: Orthopaedic Surgery

## 2019-09-26 DEATH — deceased
